# Patient Record
Sex: Male | Born: 1965
Health system: Southern US, Community
[De-identification: ages and names within clinical notes are randomized; demographics above are authoritative.]

## PROBLEM LIST (undated history)

## (undated) DIAGNOSIS — M1990 Unspecified osteoarthritis, unspecified site: Secondary | ICD-10-CM

## (undated) HISTORY — PX: VASECTOMY: SHX75

---

## 1995-07-01 HISTORY — PX: INGUINAL HERNIA REPAIR: SHX194

## 2019-04-11 DIAGNOSIS — E291 Testicular hypofunction: Secondary | ICD-10-CM | POA: Diagnosis not present

## 2019-04-11 DIAGNOSIS — Z0000 Encounter for general adult medical examination without abnormal findings: Secondary | ICD-10-CM | POA: Diagnosis not present

## 2019-04-11 DIAGNOSIS — Z1159 Encounter for screening for other viral diseases: Secondary | ICD-10-CM | POA: Diagnosis not present

## 2019-04-11 DIAGNOSIS — Z6827 Body mass index (BMI) 27.0-27.9, adult: Secondary | ICD-10-CM | POA: Diagnosis not present

## 2019-04-11 DIAGNOSIS — I10 Essential (primary) hypertension: Secondary | ICD-10-CM | POA: Diagnosis not present

## 2019-04-11 DIAGNOSIS — Z23 Encounter for immunization: Secondary | ICD-10-CM | POA: Diagnosis not present

## 2019-04-26 DIAGNOSIS — E291 Testicular hypofunction: Secondary | ICD-10-CM | POA: Diagnosis not present

## 2019-09-08 DIAGNOSIS — Z23 Encounter for immunization: Secondary | ICD-10-CM | POA: Diagnosis not present

## 2019-10-06 DIAGNOSIS — Z23 Encounter for immunization: Secondary | ICD-10-CM | POA: Diagnosis not present

## 2019-10-10 ENCOUNTER — Ambulatory Visit: Payer: Self-pay | Admitting: Legal Medicine

## 2019-10-12 ENCOUNTER — Ambulatory Visit (INDEPENDENT_AMBULATORY_CARE_PROVIDER_SITE_OTHER): Payer: 59 | Admitting: Legal Medicine

## 2019-10-12 ENCOUNTER — Other Ambulatory Visit: Payer: Self-pay

## 2019-10-12 ENCOUNTER — Encounter: Payer: Self-pay | Admitting: Legal Medicine

## 2019-10-12 DIAGNOSIS — I10 Essential (primary) hypertension: Secondary | ICD-10-CM

## 2019-10-12 DIAGNOSIS — E291 Testicular hypofunction: Secondary | ICD-10-CM | POA: Insufficient documentation

## 2019-10-12 HISTORY — DX: Essential (primary) hypertension: I10

## 2019-10-12 HISTORY — DX: Testicular hypofunction: E29.1

## 2019-10-12 NOTE — Assessment & Plan Note (Signed)
An individual hypertension care plan was established and reinforced today.  The patient's status was assessed using clinical findings on exam and labs or diagnostic tests. The patient's success at meeting treatment goals on disease specific evidence-based guidelines and found to be well controlled. SELF MANAGEMENT: The patient and I together assessed ways to personally work towards obtaining the recommended goals. RECOMMENDATIONS: avoid decongestants found in common cold remedies, decrease consumption of alcohol, perform routine monitoring of BP with home BP cuff, exercise, reduction of dietary salt, take medicines as prescribed, try not to miss doses and quit smoking.  Regular exercise and maintaining a healthy weight is needed.  Stress reduction may help. A CLINICAL SUMMARY including written plan identify barriers to care unique to individual due to social or financial issues.  We attempt to mutually creat solutions for individual and family understanding. He is to keep check on BP at home, he just got out of gym

## 2019-10-12 NOTE — Assessment & Plan Note (Signed)
Patient doing well on injectable testosterone.  Last checked 6 months ago.  Continue treatment.

## 2019-10-12 NOTE — Progress Notes (Signed)
Established Patient Office Visit  Subjective:  Patient ID: Nathan Hughes, male    DOB: [birth year]  Age: 54 y.o. MRN: [id number]  CC:  Chief Complaint  Patient presents with  . Hypertension  . Testicular Hypofunction    HPI Nathan Hughes presents for Chronic visit.  Patient presents for follow up of hypertension.  Patient tolerating losartan 33m well with side effects.  Patient was diagnosed with hypertension 2010 so has been treated for hypertension for 10 years.Patient is working on maintaining diet and exercise regimen and follows up as directed. Complication include none.  He has hypogonadism and on testosterone.  Past Medical History:  Diagnosis Date  . Benign essential hypertension 10/12/2019  . 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 INGUINAL HERNIA REPAIR Right 1997    Family History  Problem Relation Age of Onset  . Stroke Father   . Hypokalemia Sister     Social History   Socioeconomic History  . Marital status: Married    Spouse name: Not on file  . Number of children: 2  . Years of education: Not on file  . Highest education level: Not on file  Occupational History  . 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 Smoking status: Never Smoker  . Smokeless tobacco: Never Used  Substance and Sexual Activity  . Alcohol use: Yes    Alcohol/week: 4.0 standard drinks    Types: 4 Cans of beer per week    Comment: ocassionally  . Drug use: Never  . Sexual activity: Yes  Other Topics Concern  . Not on file  Social History Narrative  . Not on file   Social Determinants of Health   Financial Resource Strain:   . Difficulty of Paying Living Expenses:   Food Insecurity:   . Worried About RCharity fundraiserin the Last Year:   . RArboriculturistin the Last Year:   Transportation Needs:   . LFilm/video editor(Medical):   .Marland Kitchen Lack of Transportation (Non-Medical):   Physical Activity:   . Days of Exercise per Week:   . Minutes of Exercise per Session:   Stress:   . Feeling of Stress :   Social Connections:   . Frequency of Communication with Friends and Family:   . Frequency of Social Gatherings with Friends and Family:   . Attends Religious Services:   . Active Member of Clubs or Organizations:   . Attends CArchivistMeetings:   .Marland KitchenMarital Status:   Intimate Partner Violence:   . Fear of Current or Ex-Partner:   . Emotionally Abused:   .Marland KitchenPhysically Abused:   . Sexually Abused:     Outpatient Medications Prior to Visit  Medication Sig Dispense Refill  . losartan (COZAAR) 50 MG tablet Take 50 mg by mouth daily.    .Marland Kitchentestosterone cypionate (DEPOTESTOSTERONE CYPIONATE) 200 MG/ML injection Inject 200 mg into the muscle every 14 (fourteen) days.     No facility-administered medications prior to visit.    Allergies  Allergen Reactions  . Lisinopril Cough    ROS Review of Systems  Constitutional: Negative.   HENT: Negative.   Eyes: Negative.   Respiratory: Negative.   Cardiovascular: Negative.   Gastrointestinal: Negative.   Endocrine: Negative.   Genitourinary: Negative.   Musculoskeletal: Negative.   Skin: Negative.   Neurological: Negative.  Hematological: Negative.       Objective:    Physical Exam  Constitutional: He is oriented to person, place, and time. He appears well-developed and well-nourished.  HENT:  Head: Normocephalic and atraumatic.  Right Ear: External ear normal.  Left Ear: External ear normal.  Eyes: Pupils are equal, round, and reactive to light. Conjunctivae and EOM are normal.  Cardiovascular: Normal rate and normal heart sounds.  Abdominal: Soft. Bowel sounds are normal.  Musculoskeletal:        General: Normal range of motion.     Cervical back: Normal range of motion.  Neurological: He is alert and oriented to person, place, and time.  Skin: Skin  is dry.  Psychiatric: He has a normal mood and affect. His behavior is normal. Judgment and thought content normal.  Vitals reviewed.   BP (!) 150/100 (BP Location: Right Arm, Patient Position: Sitting)   Pulse 74   Temp 98.4 F (36.9 C) (Temporal)   Resp 16   Ht 5' 7"  (1.702 m)   Wt 184 lb (83.5 kg)   BMI 28.82 kg/m  Wt Readings from Last 3 Encounters:  10/12/19 184 lb (83.5 kg)     Health Maintenance Due  Topic Date Due  . HIV Screening  Never done  . TETANUS/TDAP  Never done  . COLONOSCOPY  Never done    There are no preventive care reminders to display for this patient.  No results found for: TSH No results found for: WBC, HGB, HCT, MCV, PLT No results found for: NA, K, CHLORIDE, CO2, GLUCOSE, BUN, CREATININE, BILITOT, ALKPHOS, AST, ALT, PROT, ALBUMIN, CALCIUM, ANIONGAP, EGFR, GFR No results found for: CHOL No results found for: HDL No results found for: LDLCALC No results found for: TRIG No results found for: CHOLHDL No results found for: HGBA1C    Assessment & Plan:   Problem List Items Addressed This Visit      Cardiovascular and Mediastinum   Benign essential hypertension    An individual hypertension care plan was established and reinforced today.  The patient's status was assessed using clinical findings on exam and labs or diagnostic tests. The patient's success at meeting treatment goals on disease specific evidence-based guidelines and found to be well controlled. SELF MANAGEMENT: The patient and I together assessed ways to personally work towards obtaining the recommended goals. RECOMMENDATIONS: avoid decongestants found in common cold remedies, decrease consumption of alcohol, perform routine monitoring of BP with home BP cuff, exercise, reduction of dietary salt, take medicines as prescribed, try not to miss doses and quit smoking.  Regular exercise and maintaining a healthy weight is needed.  Stress reduction may help. A CLINICAL SUMMARY including  written plan identify barriers to care unique to individual due to social or financial issues.  We attempt to mutually creat solutions for individual and family understanding. He is to keep check on BP at home, he just got out of gym      Relevant Medications   losartan (COZAAR) 50 MG tablet   Other Relevant Orders   CBC with Differential   Comprehensive metabolic panel   Lipid Panel     Endocrine   Testicular hypofunction    Patient doing well on injectable testosterone.  Last checked 6 months ago.  Continue treatment.         No orders of the defined types were placed in this encounter.   Follow-up: Return in about 6 months (around 04/12/2020) for fasting.    Reinaldo Meeker, MD

## 2019-10-13 ENCOUNTER — Ambulatory Visit: Payer: Self-pay | Admitting: Legal Medicine

## 2019-10-13 LAB — CBC WITH DIFFERENTIAL/PLATELET
Basophils Absolute: 0.1 10*3/uL (ref 0.0–0.2)
Basos: 2 %
EOS (ABSOLUTE): 0.2 10*3/uL (ref 0.0–0.4)
Eos: 5 %
Hematocrit: 42.9 % (ref 37.5–51.0)
Hemoglobin: 15.1 g/dL (ref 13.0–17.7)
Immature Grans (Abs): 0 10*3/uL (ref 0.0–0.1)
Immature Granulocytes: 0 %
Lymphocytes Absolute: 1.3 10*3/uL (ref 0.7–3.1)
Lymphs: 29 %
MCH: 33.4 pg — ABNORMAL HIGH (ref 26.6–33.0)
MCHC: 35.2 g/dL (ref 31.5–35.7)
MCV: 95 fL (ref 79–97)
Monocytes Absolute: 0.5 10*3/uL (ref 0.1–0.9)
Monocytes: 12 %
Neutrophils Absolute: 2.3 10*3/uL (ref 1.4–7.0)
Neutrophils: 52 %
Platelets: 260 10*3/uL (ref 150–450)
RBC: 4.52 x10E6/uL (ref 4.14–5.80)
RDW: 11.4 % — ABNORMAL LOW (ref 11.6–15.4)
WBC: 4.4 10*3/uL (ref 3.4–10.8)

## 2019-10-13 LAB — COMPREHENSIVE METABOLIC PANEL
ALT: 17 IU/L (ref 0–44)
AST: 22 IU/L (ref 0–40)
Albumin/Globulin Ratio: 1.6 (ref 1.2–2.2)
Albumin: 4.6 g/dL (ref 3.8–4.9)
Alkaline Phosphatase: 47 IU/L (ref 39–117)
BUN/Creatinine Ratio: 14 (ref 9–20)
BUN: 13 mg/dL (ref 6–24)
Bilirubin Total: 2 mg/dL — ABNORMAL HIGH (ref 0.0–1.2)
CO2: 25 mmol/L (ref 20–29)
Calcium: 9.9 mg/dL (ref 8.7–10.2)
Chloride: 99 mmol/L (ref 96–106)
Creatinine, Ser: 0.9 mg/dL (ref 0.76–1.27)
GFR calc Af Amer: 112 mL/min/{1.73_m2} (ref >59)
GFR calc non Af Amer: 96 mL/min/{1.73_m2} (ref >59)
Globulin, Total: 2.8 g/dL (ref 1.5–4.5)
Glucose: 92 mg/dL (ref 65–99)
Potassium: 4.9 mmol/L (ref 3.5–5.2)
Sodium: 138 mmol/L (ref 134–144)
Total Protein: 7.4 g/dL (ref 6.0–8.5)

## 2019-10-13 LAB — LIPID PANEL
Chol/HDL Ratio: 1.8 ratio (ref 0.0–5.0)
Cholesterol, Total: 170 mg/dL (ref 100–199)
HDL: 92 mg/dL (ref >39)
LDL Chol Calc (NIH): 69 mg/dL (ref 0–99)
Triglycerides: 44 mg/dL (ref 0–149)
VLDL Cholesterol Cal: 9 mg/dL (ref 5–40)

## 2019-10-13 LAB — CARDIOVASCULAR RISK ASSESSMENT

## 2019-10-13 NOTE — Progress Notes (Signed)
CBC normal, Kidney and liver tests normal, bilirubin up, Cholesterol normal lp

## 2019-11-13 ENCOUNTER — Other Ambulatory Visit: Payer: Self-pay | Admitting: Legal Medicine

## 2019-11-13 DIAGNOSIS — E291 Testicular hypofunction: Secondary | ICD-10-CM

## 2020-01-25 ENCOUNTER — Other Ambulatory Visit: Payer: Self-pay

## 2020-01-25 MED ORDER — LOSARTAN POTASSIUM 50 MG PO TABS: 50.0000 mg | ORAL_TABLET | Freq: Every day | ORAL | 2 refills | Status: DC

## 2020-02-05 ENCOUNTER — Other Ambulatory Visit: Payer: Self-pay | Admitting: Legal Medicine

## 2020-02-06 ENCOUNTER — Other Ambulatory Visit: Payer: Self-pay | Admitting: Legal Medicine

## 2020-02-06 MED ORDER — LOSARTAN POTASSIUM 50 MG PO TABS: 50.0000 mg | ORAL_TABLET | Freq: Every day | ORAL | 1 refills | Status: DC

## 2020-04-12 ENCOUNTER — Encounter: Payer: Self-pay | Admitting: Legal Medicine

## 2020-04-12 ENCOUNTER — Ambulatory Visit: Payer: 59 | Admitting: Legal Medicine

## 2020-04-12 ENCOUNTER — Other Ambulatory Visit: Payer: Self-pay

## 2020-04-12 VITALS — BP 110/80 | HR 63 | Temp 97.6°F | Ht 67.0 in | Wt 182.6 lb

## 2020-04-12 DIAGNOSIS — E782 Mixed hyperlipidemia: Secondary | ICD-10-CM

## 2020-04-12 DIAGNOSIS — Z6828 Body mass index (BMI) 28.0-28.9, adult: Secondary | ICD-10-CM | POA: Diagnosis not present

## 2020-04-12 DIAGNOSIS — Z0000 Encounter for general adult medical examination without abnormal findings: Secondary | ICD-10-CM

## 2020-04-12 DIAGNOSIS — E291 Testicular hypofunction: Secondary | ICD-10-CM

## 2020-04-12 DIAGNOSIS — I10 Essential (primary) hypertension: Secondary | ICD-10-CM

## 2020-04-12 DIAGNOSIS — N400 Enlarged prostate without lower urinary tract symptoms: Secondary | ICD-10-CM

## 2020-04-12 MED ORDER — LOSARTAN POTASSIUM 50 MG PO TABS: 50.0000 mg | ORAL_TABLET | Freq: Every day | ORAL | 2 refills | Status: DC

## 2020-04-12 MED ORDER — TESTOSTERONE CYPIONATE 200 MG/ML IM SOLN: INTRAMUSCULAR | 2 refills | Status: DC

## 2020-04-12 NOTE — Patient Instructions (Signed)
Preventive Care 54-54 Years Old, Male Preventive care refers to lifestyle choices and visits with your health care provider that can promote health and wellness. This includes:  A yearly physical exam. This is also called an annual well check.  Regular dental and eye exams.  Immunizations.  Screening for certain conditions.  Healthy lifestyle choices, such as eating a healthy diet, getting regular exercise, not using drugs or products that contain nicotine and tobacco, and limiting alcohol use. What can I expect for my preventive care visit? Physical exam Your health care provider will check:  Height and weight. These may be used to calculate body mass index (BMI), which is a measurement that tells if you are at a healthy weight.  Heart rate and blood pressure.  Your skin for abnormal spots. Counseling Your health care provider may ask you questions about:  Alcohol, tobacco, and drug use.  Emotional well-being.  Home and relationship well-being.  Sexual activity.  Eating habits.  Work and work Statistician. What immunizations do I need?  Influenza (flu) vaccine  This is recommended every year. Tetanus, diphtheria, and pertussis (Tdap) vaccine  You may need a Td booster every 10 years. Varicella (chickenpox) vaccine  You may need this vaccine if you have not already been vaccinated. Zoster (shingles) vaccine  You may need this after age 54. Measles, mumps, and rubella (MMR) vaccine  You may need at least one dose of MMR if you were born in 1957 or later. You may also need a second dose. Pneumococcal conjugate (PCV13) vaccine  You may need this if you have certain conditions and were not previously vaccinated. Pneumococcal polysaccharide (PPSV23) vaccine  You may need one or two doses if you smoke cigarettes or if you have certain conditions. Meningococcal conjugate (MenACWY) vaccine  You may need this if you have certain conditions. Hepatitis A  vaccine  You may need this if you have certain conditions or if you travel or work in places where you may be exposed to hepatitis A. Hepatitis B vaccine  You may need this if you have certain conditions or if you travel or work in places where you may be exposed to hepatitis B. Haemophilus influenzae type b (Hib) vaccine  You may need this if you have certain risk factors. Human papillomavirus (HPV) vaccine  If recommended by your health care provider, you may need three doses over 6 months. You may receive vaccines as individual doses or as more than one vaccine together in one shot (combination vaccines). Talk with your health care provider about the risks and benefits of combination vaccines. What tests do I need? Blood tests  Lipid and cholesterol levels. These may be checked every 5 years, or more frequently if you are over 60 years old.  Hepatitis C test.  Hepatitis B test. Screening  Lung cancer screening. You may have this screening every year starting at age 54 if you have a 30-pack-year history of smoking and currently smoke or have quit within the past 15 years.  Prostate cancer screening. Recommendations will vary depending on your family history and other risks.  Colorectal cancer screening. All adults should have this screening starting at age 54 and continuing until age 2. Your health care provider may recommend screening at age 54 if you are at increased risk. You will have tests every 1-10 years, depending on your results and the type of screening test.  Diabetes screening. This is done by checking your blood sugar (glucose) after you have not eaten  for a while (fasting). You may have this done every 1-3 years.  Sexually transmitted disease (STD) testing. Follow these instructions at home: Eating and drinking  Eat a diet that includes fresh fruits and vegetables, whole grains, lean protein, and low-fat dairy products.  Take vitamin and mineral supplements as  recommended by your health care provider.  Do not drink alcohol if your health care provider tells you not to drink.  If you drink alcohol: ? Limit how much you have to 0-2 drinks a day. ? Be aware of how much alcohol is in your drink. In the [place] one drink equals one 12 oz bottle of beer (355 mL), one 5 oz glass of wine (148 mL), or one 1 oz glass of hard liquor (44 mL). Lifestyle  Take daily care of your teeth and gums.  Stay active. Exercise for at least 30 minutes on 5 or more days each week.  Do not use any products that contain nicotine or tobacco, such as cigarettes, e-cigarettes, and chewing tobacco. If you need help quitting, ask your health care provider.  If you are sexually active, practice safe sex. Use a condom or other form of protection to prevent STIs (sexually transmitted infections).  Talk with your health care provider about taking a low-dose aspirin every day starting at age 54. What's next?  Go to your health care provider once a year for a well check visit.  Ask your health care provider how often you should have your eyes and teeth checked.  Stay up to date on all vaccines. This information is not intended to replace advice given to you by your health care provider. Make sure you discuss any questions you have with your health care provider. Document Revised: 06/10/2018 Document Reviewed: 06/10/2018 Elsevier Patient Education  2020 [employer].

## 2020-04-12 NOTE — Progress Notes (Signed)
Subjective:  Patient ID: Nathan Hughes, male    DOB: [birth year]  Age: 54 y.o. MRN: [id number]  Chief Complaint  Patient presents with  . Annual Exam    Pt is fasting, needs refills on medications    HPI  Well Adult Physical: Patient here for a comprehensive physical exam.The patient reports problems - arthritis hands an delbow left side Do you take any herbs or supplements that were not prescribed by a doctor? no Are you taking calcium supplements? no Are you taking aspirin daily? no  Encounter for general adult medical examination without abnormal findings  Physical ("At Risk" items are starred): Patient's last physical exam was 1 year ago .  Smoking: Life-long non-smoker ;  Physical Activity: Exercises at least 3 times per week ;  Alcohol/Drug Use: Is a non-drinker ; No illicit drug use ;  Patient is not afflicted from Stress Incontinence and Urge Incontinence  Safety: reviewed ; Patient wears a seat belt, has smoke detectors, has carbon monoxide detectors, practices appropriate gun safety, and wears sunscreen with extended sun exposure. Dental Care: biannual cleanings, brushes and flosses daily. Ophthalmology/Optometry: Annual visit.  Hearing loss: none Vision impairments: none Last PSA:?  Patient presents for follow up of hypertension.  Patient tolerating losartan well with side effects.  Patient was diagnosed with hypertension 2010 so has been treated for hypertension for 10 years.Patient is working on maintaining diet and exercise regimen and follows up as directed. Complication include none.  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    Office Visit from 04/12/2020 in [facility]  PHQ-2 Total Score 0              Social History   Socioeconomic History  . Marital status: Married    Spouse name: Not on file  . Number of children: 2  . Years of education: Not on file  . Highest education level: Not on file  Occupational History  . Occupation:  Event organiser: Personnel officer  . Occupation: Emergency planning/management officer  Tobacco Use  . Smoking status: Never Smoker  . Smokeless tobacco: Never Used  Substance and Sexual Activity  . Alcohol use: Yes    Alcohol/week: 4.0 standard drinks    Types: 4 Cans of beer per week    Comment: ocassionally  . Drug use: Never  . Sexual activity: Yes    Partners: Female  Other Topics Concern  . Not on file  Social History Narrative  . Not on file   Social Determinants of Health   Financial Resource Strain:   . Difficulty of Paying Living Expenses: Not on file  Food Insecurity:   . Worried About Programme researcher, broadcasting/film/video in the Last Year: Not on file  . Ran Out of Food in the Last Year: Not on file  Transportation Needs:   . Lack of Transportation (Medical): Not on file  . Lack of Transportation (Non-Medical): Not on file  Physical Activity:   . Days of Exercise per Week: Not on file  . Minutes of Exercise per Session: Not on file  Stress:   . Feeling of Stress : Not on file  Social Connections:   . Frequency of Communication with Friends and Family: Not on file  . Frequency of Social Gatherings with Friends and Family: Not on file  . Attends Religious Services: Not on file  . Active Member of Clubs or Organizations: Not on file  . Attends Banker Meetings: Not on  file  . Marital Status: Not on file   Past Medical History:  Diagnosis Date  . Benign essential hypertension 10/12/2019  . 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 INGUINAL HERNIA REPAIR Right 1997    Family History  Problem Relation Age of Onset  . Stroke Father   . Hypokalemia Sister    Social History   Socioeconomic History  . Marital status: Married    Spouse name: Not on file  . Number of children: 2  . Years of education: Not on file  . Highest education level: Not on file  Occupational History  . 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Engineer, maintenance  . Occupation: Emergency planning/management officer  Tobacco Use  . Smoking status: Never Smoker  . Smokeless tobacco: Never Used  Substance and Sexual Activity  . Alcohol use: Yes    Alcohol/week: 4.0 standard drinks    Types: 4 Cans of beer per week    Comment: ocassionally  . Drug use: Never  . Sexual activity: Yes    Partners: Female  Other Topics Concern  . Not on file  Social History Narrative  . Not on file   Social Determinants of Health   Financial Resource Strain:   . Difficulty of Paying Living Expenses: Not on file  Food Insecurity:   . Worried About Programme researcher, broadcasting/film/video in the Last Year: Not on file  . Ran Out of Food in the Last Year: Not on file  Transportation Needs:   . Lack of Transportation (Medical): Not on file  . Lack of Transportation (Non-Medical): Not on file  Physical Activity:   . Days of Exercise per Week: Not on file  . Minutes of Exercise per Session: Not on file  Stress:   . Feeling of Stress : Not on file  Social Connections:   . Frequency of Communication with Friends and Family: Not on file  . Frequency of Social Gatherings with Friends and Family: Not on file  . Attends Religious Services: Not on file  . Active Member of Clubs or Organizations: Not on file  . Attends Banker Meetings: Not on file  . Marital Status: Not on file   Review of Systems  Constitutional: Negative for activity change and appetite change.  HENT: Negative.   Eyes: Negative.   Respiratory: Negative for cough, shortness of breath and stridor.   Cardiovascular: Negative for chest pain, palpitations and leg swelling.  Gastrointestinal: Negative.   Endocrine: Negative.   Genitourinary: Negative.   Musculoskeletal: Negative.   Skin: Negative.   Neurological: Negative.   Psychiatric/Behavioral: Negative.      Objective:  BP 110/80   Pulse 63   Temp 97.6 F (36.4 C)   Ht 5\' 7"  (1.702 m)   Wt 182 lb 9.6 oz (82.8 kg)   SpO2 97%   BMI 28.60 kg/m    BP/Weight 04/12/2020 10/12/2019  Systolic BP 110 150  Diastolic BP 80 100  Wt. (Lbs) 182.6 184  BMI 28.6 28.82    Physical Exam Vitals reviewed.  Constitutional:      Appearance: Normal appearance. He is normal weight.  HENT:     Head: Normocephalic and atraumatic.     Right Ear: Tympanic membrane, ear canal and external ear normal.     Left Ear: Tympanic membrane, ear canal and external ear normal.     Nose: Nose normal.     Mouth/Throat:  Pharynx: Oropharynx is clear.  Eyes:     Extraocular Movements: Extraocular movements intact.     Conjunctiva/sclera: Conjunctivae normal.     Pupils: Pupils are equal, round, and reactive to light.  Cardiovascular:     Rate and Rhythm: Normal rate and regular rhythm.     Pulses: Normal pulses.     Heart sounds: Normal heart sounds.  Pulmonary:     Effort: Pulmonary effort is normal.     Breath sounds: Normal breath sounds.  Abdominal:     General: Abdomen is flat. Bowel sounds are normal.  Genitourinary:    Comments: Prostate exam refused by patient Musculoskeletal:        General: Normal range of motion.     Cervical back: Normal range of motion and neck supple.  Skin:    General: Skin is warm.     Capillary Refill: Capillary refill takes less than 2 seconds.     Findings: No rash.  Neurological:     General: No focal deficit present.     Mental Status: He is alert and oriented to person, place, and time. Mental status is at baseline.  Psychiatric:        Mood and Affect: Mood normal.        Behavior: Behavior normal.        Thought Content: Thought content normal.        Judgment: Judgment normal.     Lab Results  Component Value Date   WBC 4.7 04/12/2020   HGB 15.3 04/12/2020   HCT 44.9 04/12/2020   PLT 250 04/12/2020   GLUCOSE 89 04/12/2020   CHOL 189 04/12/2020   TRIG 61 04/12/2020   HDL 85 04/12/2020   LDLCALC 93 04/12/2020   ALT 23 04/12/2020   AST 22 04/12/2020   NA 138 04/12/2020   K 5.0 04/12/2020    CL 99 04/12/2020   CREATININE 1.01 04/12/2020   BUN 24 04/12/2020   CO2 25 04/12/2020      Assessment & Plan:  1. BMI 28.0-28.9,adult Weight stable we discussed maybe 5 lb weight loss  2. Benign essential hypertension - losartan (COZAAR) 50 MG tablet; Take 1 tablet (50 mg total) by mouth daily.  Dispense: 90 tablet; Refill: 2 - CBC with Differential/Platelet - Comprehensive metabolic panel An individual hypertension care plan was established and reinforced today.  The patient's status was assessed using clinical findings on exam and labs or diagnostic tests. The patient's success at meeting treatment goals on disease specific evidence-based guidelines and found to be well controlled. SELF MANAGEMENT: The patient and I together assessed ways to personally work towards obtaining the recommended goals. RECOMMENDATIONS: avoid decongestants found in common cold remedies, decrease consumption of alcohol, perform routine monitoring of BP with home BP cuff, exercise, reduction of dietary salt, take medicines as prescribed, try not to miss doses and quit smoking.  Regular exercise and maintaining a healthy weight is needed.  Stress reduction may help. A CLINICAL SUMMARY including written plan identify barriers to care unique to individual due to social or financial issues.  We attempt to mutually creat solutions for individual and family understanding.  3. Testicular hypofunction - testosterone cypionate (DEPOTESTOSTERONE CYPIONATE) 200 MG/ML injection; INJECT 1 MILLILITERS (200 MG) BY INTRAMUSCULAR ROUTE EVERY 2 WEEKS  Dispense: 10 mL; Refill: 2 AN INDIVIDUAL CARE PLAN for testicular hypofunction was established and reinforced today.  The patient's status was assessed using clinical findings on exam, labs, and other diagnostic testing. Patient's success at meeting  treatment goals based on disease specific evidence-bassed guidelines and found to be in good control. RECOMMENDATIONS include  maintaining present medicines and treatment.  4. Benign prostatic hyperplasia without lower urinary tract symptoms AN INDIVIDUAL CARE PLAN History of BPH was established and reinforced today.  The patient's status was assessed using clinical findings on exam, labs, and other diagnostic testing. Patient's success at meeting treatment goals based on disease specific evidence-bassed guidelines and found to be in good control. RECOMMENDATIONS include maintaining present medicines and treatment.  5. Routine general medical examination at a health care facility - PSA Routine wellness discussed  6. Mixed hyperlipidemia - Lipid panel AN INDIVIDUAL CARE PLAN for hyperlipidemia/ cholesterol was established and reinforced today.  The patient's status was assessed using clinical findings on exam, lab and other diagnostic tests. The patient's disease status was assessed based on evidence-based guidelines and found to be well controlled. MEDICATIONS were reviewed. SELF MANAGEMENT GOALS have been discussed and patient's success at attaining the goal of low cholesterol was assessed. RECOMMENDATION given include regular exercise 3 days a week and low cholesterol/low fat diet. CLINICAL SUMMARY including written plan to identify barriers unique to the patient due to social or economic  reasons was discussed.    Body mass index is 28.6 kg/m.   These are the goals we discussed: Goals    . Activity and Exercise Increased     Evidence-based guidance:  Review current exercise levels.  Assess patient perspective on exercise or activity level, barriers to increasing activity, motivation and readiness for change.  Recommend or set healthy exercise goal based on individual tolerance.  Encourage small steps toward making change in amount of exercise or activity.  Urge reduction of sedentary activities or screen time.  Promote group activities within the community or with family or support person.  Consider  referral to rehabiliation therapist for assessment and exercise/activity plan.   Notes:         This is a list of the screening recommended for you and due dates:  Health Maintenance  Topic Date Due  .  Hepatitis C: One time screening is recommended by Center for Disease Control  (CDC) for  adults born from [birth year] through 1965.   Never done  . HIV Screening  Never done  . Tetanus Vaccine  Never done  . Flu Shot  01/29/2020  . Colon Cancer Screening  10/31/2028  . COVID-19 Vaccine  Completed     AN INDIVIDUALIZED CARE PLAN: was established or reinforced today.   SELF MANAGEMENT: The patient and I together assessed ways to personally work towards obtaining the recommended goals  Support needs The patient and/or family needs were assessed and services were offered if appropriate.  Meds ordered this encounter  Medications  . losartan (COZAAR) 50 MG tablet    Sig: Take 1 tablet (50 mg total) by mouth daily.    Dispense:  90 tablet    Refill:  2  . testosterone cypionate (DEPOTESTOSTERONE CYPIONATE) 200 MG/ML injection    Sig: INJECT 1 MILLILITERS (200 MG) BY INTRAMUSCULAR ROUTE EVERY 2 WEEKS    Dispense:  10 mL    Refill:  2    This request is for a new prescription for a controlled substance as required by Federal/State law.    Follow-up: Return in about 1 year (around 04/12/2021).  An After Visit Summary was printed and given to the patient.  Brent Bulla [facility] [phone]

## 2020-04-13 LAB — CBC WITH DIFFERENTIAL/PLATELET
Basophils Absolute: 0.1 10*3/uL (ref 0.0–0.2)
Basos: 2 %
EOS (ABSOLUTE): 0.2 10*3/uL (ref 0.0–0.4)
Eos: 4 %
Hematocrit: 44.9 % (ref 37.5–51.0)
Hemoglobin: 15.3 g/dL (ref 13.0–17.7)
Immature Grans (Abs): 0 10*3/uL (ref 0.0–0.1)
Immature Granulocytes: 0 %
Lymphocytes Absolute: 1.4 10*3/uL (ref 0.7–3.1)
Lymphs: 30 %
MCH: 32.8 pg (ref 26.6–33.0)
MCHC: 34.1 g/dL (ref 31.5–35.7)
MCV: 96 fL (ref 79–97)
Monocytes Absolute: 0.6 10*3/uL (ref 0.1–0.9)
Monocytes: 12 %
Neutrophils Absolute: 2.4 10*3/uL (ref 1.4–7.0)
Neutrophils: 52 %
Platelets: 250 10*3/uL (ref 150–450)
RBC: 4.67 x10E6/uL (ref 4.14–5.80)
RDW: 11.4 % — ABNORMAL LOW (ref 11.6–15.4)
WBC: 4.7 10*3/uL (ref 3.4–10.8)

## 2020-04-13 LAB — COMPREHENSIVE METABOLIC PANEL
ALT: 23 IU/L (ref 0–44)
AST: 22 IU/L (ref 0–40)
Albumin/Globulin Ratio: 1.8 (ref 1.2–2.2)
Albumin: 4.8 g/dL (ref 3.8–4.9)
Alkaline Phosphatase: 51 IU/L (ref 44–121)
BUN/Creatinine Ratio: 24 — ABNORMAL HIGH (ref 9–20)
BUN: 24 mg/dL (ref 6–24)
Bilirubin Total: 2.6 mg/dL — ABNORMAL HIGH (ref 0.0–1.2)
CO2: 25 mmol/L (ref 20–29)
Calcium: 10 mg/dL (ref 8.7–10.2)
Chloride: 99 mmol/L (ref 96–106)
Creatinine, Ser: 1.01 mg/dL (ref 0.76–1.27)
GFR calc Af Amer: 97 mL/min/{1.73_m2} (ref >59)
GFR calc non Af Amer: 84 mL/min/{1.73_m2} (ref >59)
Globulin, Total: 2.7 g/dL (ref 1.5–4.5)
Glucose: 89 mg/dL (ref 65–99)
Potassium: 5 mmol/L (ref 3.5–5.2)
Sodium: 138 mmol/L (ref 134–144)
Total Protein: 7.5 g/dL (ref 6.0–8.5)

## 2020-04-13 LAB — CARDIOVASCULAR RISK ASSESSMENT

## 2020-04-13 LAB — LIPID PANEL
Chol/HDL Ratio: 2.2 ratio (ref 0.0–5.0)
Cholesterol, Total: 189 mg/dL (ref 100–199)
HDL: 85 mg/dL (ref >39)
LDL Chol Calc (NIH): 93 mg/dL (ref 0–99)
Triglycerides: 61 mg/dL (ref 0–149)
VLDL Cholesterol Cal: 11 mg/dL (ref 5–40)

## 2020-04-13 LAB — PSA: Prostate Specific Ag, Serum: 1.9 ng/mL (ref 0.0–4.0)

## 2020-04-13 NOTE — Progress Notes (Signed)
CBC normal, kidney and liver test normal, bilirubin is up from testosterone, Cholesterol OK, PSA 1.9 lp

## 2020-05-18 ENCOUNTER — Other Ambulatory Visit: Payer: Self-pay | Admitting: Legal Medicine

## 2020-05-18 ENCOUNTER — Other Ambulatory Visit: Payer: Self-pay

## 2020-05-18 DIAGNOSIS — E291 Testicular hypofunction: Secondary | ICD-10-CM

## 2020-05-18 MED ORDER — TESTOSTERONE CYPIONATE 200 MG/ML IM SOLN: INTRAMUSCULAR | 3 refills | Status: DC

## 2020-05-28 ENCOUNTER — Other Ambulatory Visit: Payer: Self-pay

## 2020-05-28 DIAGNOSIS — E291 Testicular hypofunction: Secondary | ICD-10-CM

## 2020-05-28 MED ORDER — TESTOSTERONE CYPIONATE 200 MG/ML IM SOLN: INTRAMUSCULAR | 3 refills | Status: DC

## 2020-06-21 MED FILL — TESTOSTERONE CYP 200 MG/ML: 200 | 28 days supply | Qty: 2 | Fill #0

## 2020-07-12 DIAGNOSIS — Z23 Encounter for immunization: Secondary | ICD-10-CM | POA: Diagnosis not present

## 2020-08-08 MED FILL — TESTOSTERONE CYP 200 MG/ML: 200 | 28 days supply | Qty: 2 | Fill #1

## 2020-09-11 ENCOUNTER — Other Ambulatory Visit: Payer: Self-pay

## 2020-09-11 DIAGNOSIS — Z005 Encounter for examination of potential donor of organ and tissue: Secondary | ICD-10-CM

## 2020-09-17 NOTE — Progress Notes (Signed)
Cardiology Office Note:   Date:  09/19/2020  NAME:  Nathan Hughes    MRN: [id number] DOB:  1966-05-20   PCP:  Abigail Miyamoto, MD  Cardiologist:  No primary care provider on file.  Electrophysiologist:  None   Referring MD: Abigail Miyamoto,*   Chief Complaint  Patient presents with  . Transplant Donor Evaluation    History of Present Illness:   Nathan Hughes is a 55 y.o. male with a hx of low testosterone who is being seen today for the evaluation of willingness to be an organ donor at the request of Abigail Miyamoto, MD. Needs echocardiogram and stress test to be a donor.   He reports he will donate a kidney to his sister in [place].  He will undergo kidney donation in [place] at the [facility].  His medical history is significant for hypertension.  BP is well controlled 132/84.  He takes losartan.  He also takes testosterone supplementation.  He reports that he has no symptoms of chest pain or shortness of breath.  His EKG demonstrates a first-degree AV block.  There is no evidence of acute ischemic changes or evidence of infarction.  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  He works at the hospital.  Apparently he retired but had to find a part-time job.  His sister suffers from a rare kidney disorder.  He is a never smoker.  He does not drink alcohol in excess.  No drug use is reported.  He is married and has 2 children.  Most recent lipid profile demonstrates exceedingly high HDL cholesterol.  Family history of heart diseases present.  He reports he can walk 30,000 steps daily without any limitations.  He also does quite a bit of weight lifting.  No limitations with this either.  T chol 189, HDL 85, LDL 93, TG 61  Past Medical History: Past Medical History:  Diagnosis Date  . Benign essential hypertension 10/12/2019  . 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 INGUINAL HERNIA REPAIR Right 1997    Current Medications: Current Meds  Medication Sig  . losartan (COZAAR) 50 MG tablet Take 1 tablet (50 mg total) by mouth daily.  Marland Kitchen testosterone cypionate (DEPOTESTOSTERONE CYPIONATE) 200 MG/ML injection One ml IM every 2 weeks     Allergies:    Lisinopril   Social History: Social History   Socioeconomic History  . Marital status: Married    Spouse name: Not on file  . Number of children: 2  . Years of education: Not on file  . Highest education level: Not on file  Occupational History  . 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Personnel officer  . Occupation: Emergency planning/management officer  Tobacco Use  . Smoking status: Never Smoker  . Smokeless tobacco: Never Used  Substance and Sexual Activity  . Alcohol use: Yes    Alcohol/week: 4.0 standard drinks    Types: 4 Cans of beer per week    Comment: ocassionally  . Drug use: Never  . Sexual activity: Yes    Partners: Female  Other Topics Concern  . Not on file  Social History Narrative  . Not on file   Social Determinants of Health   Financial Resource Strain: Not on file  Food Insecurity: Not on file  Transportation Needs: Not on file  Physical Activity: Not on file  Stress: Not on file  Social Connections: Not on file    Family History: The patient's family history includes Heart disease in his father; Hypokalemia in his sister; Kidney disease in his sister; Stroke in his father.  ROS:   All other ROS reviewed and negative. Pertinent positives noted in the HPI.     EKGs/Labs/Other Studies Reviewed:   The following studies were personally reviewed by me today:  EKG:  EKG is ordered today.  The ekg ordered today demonstrates normal sinus rhythm heart rate 68, first-degree AV block noted, no acute ischemic changes or evidence of infarction, and was personally reviewed by me.   Recent Labs: 04/12/2020: ALT 23; BUN 24; Creatinine, Ser 1.01;  Hemoglobin 15.3; Platelets 250; Potassium 5.0; Sodium 138   Recent Lipid Panel    Component Value Date/Time   CHOL 189 04/12/2020 0810   TRIG 61 04/12/2020 0810   HDL 85 04/12/2020 0810   CHOLHDL 2.2 04/12/2020 0810   LDLCALC 93 04/12/2020 0810    Physical Exam:   VS:  BP 132/84   Pulse 70   Ht 5\' 9"  (1.753 m)   Wt 193 lb 9.6 oz (87.8 kg)   SpO2 99%   BMI 28.59 kg/m    Wt Readings from Last 3 Encounters:  09/19/20 193 lb 9.6 oz (87.8 kg)  04/12/20 182 lb 9.6 oz (82.8 kg)  10/12/19 184 lb (83.5 kg)    General: Well nourished, well developed, in no acute distress Head: Atraumatic, normal size  Eyes: PEERLA, EOMI  Neck: Supple, no JVD Endocrine: No thryomegaly Cardiac: Normal S1, S2; RRR; no murmurs, rubs, or gallops Lungs: Clear to auscultation bilaterally, no wheezing, rhonchi or rales  Abd: Soft, nontender, no hepatomegaly  Ext: No edema, pulses 2+ Musculoskeletal: No deformities, BUE and BLE strength normal and equal Skin: Warm and dry, no rashes   Neuro: Alert and oriented to person, place, time, and situation, CNII-XII grossly intact, no focal deficits  Psych: Normal mood and affect   ASSESSMENT:   Nathan Hughes is a 55 y.o. male who presents for the following: 1. Willingness to be an organ donor   2. First degree AV block   3. Primary hypertension     PLAN:   1. Willingness to be an organ donor -Evaluation for his willingness to be a donor.  Apparently he will donate a kidney to his sister.  EKG in office demonstrates a first-degree AV block which is a benign entity.  Cardiovascular examination today is normal. -[facility] will require an echocardiogram as well as a stress test.  We will set him up for an exercise nuclear stress test.  He has no symptoms of angina.  He has no shortness of breath.  He is quite healthy.  I think he will be a great candidate for organ donation.  2. First degree AV block -Benign entity.  No  need for further evaluation.  He will get an echo and a stress test as a part of his work-up for organ donation.  3. Primary hypertension -Well-controlled on losartan.  Disposition: Return if symptoms worsen or fail to improve.  Medication Adjustments/Labs and Tests Ordered: Current medicines are reviewed at length with the patient today.  Concerns regarding medicines are outlined above.  Orders Placed This Encounter  Procedures  . MYOCARDIAL PERFUSION IMAGING  . EKG 12-Lead  . ECHOCARDIOGRAM COMPLETE   No orders of the defined types were placed in this encounter.   Patient  Instructions  Medication Instructions:  The current medical regimen is effective;  continue present plan and medications.  *If you need a refill on your cardiac medications before your next appointment, please call your pharmacy*  Testing/Procedures:  Your physician has requested that you have an Exercise Myoview (schedule at [address]). A cardiac stress test is a cardiological test that measures the heart's ability to respond to external stress in a controlled clinical environment. The stress response is induced by exercise (exercise-treadmill). For further information please visit [email or web address] If you have questions or concerns about your appointment, you can call the Nuclear Lab at [phone]. *COVID TESTING NEEDED*   Echocardiogram - Your physician has requested that you have an echocardiogram. Echocardiography is a painless test that uses sound waves to create images of your heart. It provides your doctor with information about the size and shape of your heart and how well your heart's chambers and valves are working. This procedure takes approximately one hour. There are no restrictions for this procedure. This will be performed at our [facility] location - [address].   Follow-Up: At [facility], you and your health needs are our priority.  As part of our continuing mission to  provide you with exceptional heart care, we have created designated Provider Care Teams.  These Care Teams include your primary Cardiologist (physician) and Advanced Practice Providers (APPs -  Physician Assistants and Nurse Practitioners) who all work together to provide you with the care you need, when you need it.  We recommend signing up for the patient portal called "MyChart".  Sign up information is provided on this After Visit Summary.  MyChart is used to connect with patients for Virtual Visits (Telemedicine).  Patients are able to view lab/test results, encounter notes, upcoming appointments, etc.  Non-urgent messages can be sent to your provider as well.   To learn more about what you can do with MyChart, go to [email or web address].    Your next appointment:   As needed  The format for your next appointment:   In Person  Provider:   Lennie Odor, MD        Signed, Lenna Gilford. Flora Lipps, MD, [facility]  [facility]  [address], [address] [place], [place] [address] [phone]  09/19/2020 2:14 PM

## 2020-09-19 ENCOUNTER — Encounter: Payer: Self-pay | Admitting: Cardiovascular Disease

## 2020-09-19 ENCOUNTER — Ambulatory Visit (INDEPENDENT_AMBULATORY_CARE_PROVIDER_SITE_OTHER): Payer: 59 | Admitting: Cardiovascular Disease

## 2020-09-19 ENCOUNTER — Other Ambulatory Visit: Payer: Self-pay

## 2020-09-19 VITALS — BP 132/84 | HR 70 | Ht 69.0 in | Wt 193.6 lb

## 2020-09-19 DIAGNOSIS — I440 Atrioventricular block, first degree: Secondary | ICD-10-CM | POA: Diagnosis not present

## 2020-09-19 DIAGNOSIS — I10 Essential (primary) hypertension: Secondary | ICD-10-CM

## 2020-09-19 DIAGNOSIS — Z005 Encounter for examination of potential donor of organ and tissue: Secondary | ICD-10-CM | POA: Diagnosis not present

## 2020-09-19 NOTE — Patient Instructions (Addendum)
Medication Instructions:  The current medical regimen is effective;  continue present plan and medications.  *If you need a refill on your cardiac medications before your next appointment, please call your pharmacy*  Testing/Procedures:  Your physician has requested that you have an Exercise Myoview (schedule at [address]). A cardiac stress test is a cardiological test that measures the heart's ability to respond to external stress in a controlled clinical environment. The stress response is induced by exercise (exercise-treadmill). For further information please visit [email or web address] If you have questions or concerns about your appointment, you can call the Nuclear Lab at [phone]. *COVID TESTING NEEDED*   Echocardiogram - Your physician has requested that you have an echocardiogram. Echocardiography is a painless test that uses sound waves to create images of your heart. It provides your doctor with information about the size and shape of your heart and how well your heart's chambers and valves are working. This procedure takes approximately one hour. There are no restrictions for this procedure. This will be performed at our [facility] location - [address].   Follow-Up: At [facility], you and your health needs are our priority.  As part of our continuing mission to provide you with exceptional heart care, we have created designated Provider Care Teams.  These Care Teams include your primary Cardiologist (physician) and Advanced Practice Providers (APPs -  Physician Assistants and Nurse Practitioners) who all work together to provide you with the care you need, when you need it.  We recommend signing up for the patient portal called "MyChart".  Sign up information is provided on this After Visit Summary.  MyChart is used to connect with patients for Virtual Visits (Telemedicine).  Patients are able to view lab/test results, encounter notes, upcoming appointments,  etc.  Non-urgent messages can be sent to your provider as well.   To learn more about what you can do with MyChart, go to [email or web address].    Your next appointment:   As needed  The format for your next appointment:   In Person  Provider:   Lennie Odor, MD

## 2020-09-20 ENCOUNTER — Telehealth: Payer: Self-pay

## 2020-09-20 NOTE — Addendum Note (Signed)
Addended by: Cydney Ok on: 09/20/2020 09:39 AM   Modules accepted: Orders

## 2020-09-20 NOTE — Telephone Encounter (Signed)
Spoke with the patient, detailed instructions given. He stated that he understood and would be here for his test. Asked to call back with any questions. S.Williams EMTP 

## 2020-09-21 ENCOUNTER — Other Ambulatory Visit (HOSPITAL_COMMUNITY)
Admission: RE | Admit: 2020-09-21 | Discharge: 2020-09-21 | Disposition: A | Discharge status: Home / Self Care | Payer: 59 | Source: Ambulatory Visit | Attending: Cardiovascular Disease | Admitting: Cardiovascular Disease

## 2020-09-21 DIAGNOSIS — Z20822 Contact with and (suspected) exposure to covid-19: Secondary | ICD-10-CM | POA: Diagnosis not present

## 2020-09-21 DIAGNOSIS — Z01812 Encounter for preprocedural laboratory examination: Secondary | ICD-10-CM | POA: Diagnosis not present

## 2020-09-21 LAB — SARS CORONAVIRUS 2 (TAT 6-24 HRS): SARS Coronavirus 2: NEGATIVE

## 2020-09-21 NOTE — Addendum Note (Signed)
Addended by: Sande Rives on: 09/21/2020 12:49 PM   Modules accepted: Orders

## 2020-09-24 MED FILL — TESTOSTERONE CYP 200 MG/ML: 200 | 28 days supply | Qty: 2 | Fill #2

## 2020-09-25 ENCOUNTER — Other Ambulatory Visit: Payer: Self-pay

## 2020-09-25 ENCOUNTER — Ambulatory Visit (HOSPITAL_COMMUNITY): Payer: PRIVATE HEALTH INSURANCE | Attending: Internal Medicine

## 2020-09-25 DIAGNOSIS — Z005 Encounter for examination of potential donor of organ and tissue: Secondary | ICD-10-CM | POA: Insufficient documentation

## 2020-09-25 LAB — MYOCARDIAL PERFUSION IMAGING
Estimated workload: 13.4 METS
Exercise duration (min): 12 min
Exercise duration (sec): 0 s
LV dias vol: 113 mL (ref 62–150)
LV sys vol: 37 mL
MPHR: 165 {beats}/min
Peak HR: 148 {beats}/min
Percent HR: 89 %
Rest HR: 62 {beats}/min
SDS: 0
SRS: 0
SSS: 0
TID: 0.97

## 2020-09-25 MED ORDER — TECHNETIUM TC 99M TETROFOSMIN IV KIT
32.4000 | PACK | Freq: Once | INTRAVENOUS | Status: AC | PRN
  Administered 2020-09-25: 32.4 via INTRAVENOUS
  Filled 2020-09-25: qty 33

## 2020-09-25 MED ORDER — TECHNETIUM TC 99M TETROFOSMIN IV KIT
10.4000 | PACK | Freq: Once | INTRAVENOUS | Status: AC | PRN
  Administered 2020-09-25: 10.4 via INTRAVENOUS
  Filled 2020-09-25: qty 11

## 2020-10-11 ENCOUNTER — Ambulatory Visit (INDEPENDENT_AMBULATORY_CARE_PROVIDER_SITE_OTHER): Payer: PRIVATE HEALTH INSURANCE

## 2020-10-11 ENCOUNTER — Other Ambulatory Visit: Payer: Self-pay

## 2020-10-11 DIAGNOSIS — Z005 Encounter for examination of potential donor of organ and tissue: Secondary | ICD-10-CM | POA: Diagnosis not present

## 2020-10-11 LAB — ECHOCARDIOGRAM COMPLETE
Area-P 1/2: 4.46 cm2
S' Lateral: 2.5 cm

## 2020-10-11 NOTE — Progress Notes (Signed)
Complete echocardiogram performed.  Jimmy Mozella Rexrode RDCS, RVT  

## 2020-10-12 ENCOUNTER — Telehealth: Payer: Self-pay | Admitting: Cardiovascular Disease

## 2020-10-12 NOTE — Telephone Encounter (Signed)
Patient called w/results °

## 2020-10-12 NOTE — Telephone Encounter (Signed)
Sande Rives, MD  10/12/2020 7:55 AM EDT      Please let him know his heart ultrasound was normal. Normal heart function and normal heart valves.  Gerri Spore T. Flora Lipps, MD, [facility]  [facility]  [address], [address] [place], [place] [address] [phone]  7:55 AM

## 2020-10-12 NOTE — Telephone Encounter (Signed)
Pt states he had a recent Echo 10/11/2020 and was told he would receive a call with his results, Pt states no one has reached out to him with his results. Please advise

## 2020-10-17 DIAGNOSIS — Z01818 Encounter for other preprocedural examination: Secondary | ICD-10-CM | POA: Diagnosis not present

## 2020-10-17 DIAGNOSIS — M47816 Spondylosis without myelopathy or radiculopathy, lumbar region: Secondary | ICD-10-CM | POA: Diagnosis not present

## 2020-10-17 DIAGNOSIS — K429 Umbilical hernia without obstruction or gangrene: Secondary | ICD-10-CM | POA: Diagnosis not present

## 2020-10-17 DIAGNOSIS — Z524 Kidney donor: Secondary | ICD-10-CM | POA: Diagnosis not present

## 2020-10-23 ENCOUNTER — Other Ambulatory Visit (HOSPITAL_COMMUNITY): Payer: Self-pay

## 2020-10-23 MED FILL — Testosterone Cypionate IM Inj in Oil 200 MG/ML: INTRAMUSCULAR | 28 days supply | Qty: 2 | Fill #0 | Status: AC

## 2020-10-30 ENCOUNTER — Telehealth: Payer: Self-pay | Admitting: Cardiovascular Disease

## 2020-10-30 NOTE — Telephone Encounter (Signed)
Called patient back to advise of message from MD.  Patient did not answer, LVM to call back.

## 2020-10-30 NOTE — Telephone Encounter (Signed)
Patient returned call- give response from MD. Patient states that he will follow up with his PCP.  No other questions or concerns.

## 2020-10-30 NOTE — Telephone Encounter (Signed)
Wanted to make you aware of his denial, any recommendations for appointment of HTN?

## 2020-10-30 NOTE — Telephone Encounter (Signed)
His blood pressure is well controlled on one medication. I am surprised he was denied for this. His BP was well controlled during my exam. No need to see me. He can see his primary doctor moving forward. All of his heart testing was normal.   Gerri Spore T. Flora Lipps, MD, [facility]  [facility]  [address] [place], [place] [address] [phone]  4:01 PM

## 2020-10-30 NOTE — Telephone Encounter (Signed)
New message:    Patient calling stating that he was trying to be a donor for a kidney transplant for some one and stated they denied  patient due to high BP. Patient calling to see if he need to follow up with the doctor.

## 2020-11-22 ENCOUNTER — Other Ambulatory Visit (HOSPITAL_COMMUNITY): Payer: Self-pay

## 2020-11-22 MED FILL — Testosterone Cypionate IM Inj in Oil 200 MG/ML: INTRAMUSCULAR | 28 days supply | Qty: 2 | Fill #1 | Status: AC

## 2020-12-25 ENCOUNTER — Other Ambulatory Visit (HOSPITAL_COMMUNITY): Payer: Self-pay

## 2020-12-25 ENCOUNTER — Other Ambulatory Visit: Payer: Self-pay | Admitting: Legal Medicine

## 2020-12-25 DIAGNOSIS — E291 Testicular hypofunction: Secondary | ICD-10-CM

## 2020-12-25 MED ORDER — TESTOSTERONE CYPIONATE 200 MG/ML IM SOLN
INTRAMUSCULAR | 3 refills | Status: DC
  Filled 2020-12-25 – 2021-01-01 (×3): qty 6, 84d supply, fill #0
  Filled 2021-01-02: qty 2, 28d supply, fill #0

## 2020-12-26 ENCOUNTER — Other Ambulatory Visit (HOSPITAL_COMMUNITY): Payer: Self-pay

## 2020-12-30 DIAGNOSIS — R059 Cough, unspecified: Secondary | ICD-10-CM | POA: Diagnosis not present

## 2021-01-01 ENCOUNTER — Other Ambulatory Visit (HOSPITAL_COMMUNITY): Payer: Self-pay

## 2021-01-01 ENCOUNTER — Encounter: Payer: Self-pay | Admitting: Nurse Practitioner

## 2021-01-01 ENCOUNTER — Telehealth: Payer: Self-pay

## 2021-01-01 NOTE — Telephone Encounter (Signed)
Patient called and left message wanting call back, called patient back and patient stated that He went to an urgent care over the weekend specifically July 3rd and [place] and the notes are actually in the chart. Patient was seen and his COVID Positive test came back today. The patient stated that his symptoms had started last Thursday which would of been 12/27/2020, and stated that the urgent care where he was seen in [place] did not write him any anti virals, and he believes that he needs antibiotic for the head congestion and nasal congestion he is having. He is normally seen by Dr. Marina Goodell and I told the patient with COVID all we necessarily do is treat the symptoms with OTC meds and or get the anti virals which is exactly what the Urgent care doctor told him as well. Patient still wanted to ask to see if we could send something in for patient? Please advise!

## 2021-01-02 ENCOUNTER — Other Ambulatory Visit (HOSPITAL_COMMUNITY): Payer: Self-pay

## 2021-01-02 ENCOUNTER — Encounter: Payer: Self-pay | Admitting: Nurse Practitioner

## 2021-01-02 ENCOUNTER — Telehealth (INDEPENDENT_AMBULATORY_CARE_PROVIDER_SITE_OTHER): Payer: 59 | Admitting: Nurse Practitioner

## 2021-01-02 DIAGNOSIS — U071 COVID-19: Secondary | ICD-10-CM

## 2021-01-02 DIAGNOSIS — J329 Chronic sinusitis, unspecified: Secondary | ICD-10-CM

## 2021-01-02 DIAGNOSIS — J310 Chronic rhinitis: Secondary | ICD-10-CM | POA: Diagnosis not present

## 2021-01-02 MED ORDER — SALINE SPRAY 0.65 % NA SOLN: 1.0000 | NASAL | 0 refills | Status: DC | PRN

## 2021-01-02 MED ORDER — NIRMATRELVIR/RITONAVIR (PAXLOVID)TABLET: 3.0000 | ORAL_TABLET | Freq: Two times a day (BID) | ORAL | 0 refills | Status: AC

## 2021-01-02 MED ORDER — FLUTICASONE PROPIONATE 50 MCG/ACT NA SUSP: 2.0000 | Freq: Every day | NASAL | 6 refills | Status: DC

## 2021-01-02 NOTE — Progress Notes (Signed)
Virtual Visit via Telephone Note   This visit type was conducted due to national recommendations for restrictions regarding the COVID-19 Pandemic (e.g. social distancing) in an effort to limit this patient's exposure and mitigate transmission in our community.  Due to his co-morbid illnesses, this patient is at least at moderate risk for complications without adequate follow up.  This format is felt to be most appropriate for this patient at this time.  The patient did not have access to video technology/had technical difficulties with video requiring transitioning to audio format only (telephone).  All issues noted in this document were discussed and addressed.  No physical exam could be performed with this format.  Patient verbally consented to a telehealth visit.   Date:  01/02/2021   ID:  [name], DOB May 30, 1966, MRN [id number]  Patient Location: Home Provider Location: Office/Clinic  PCP:  Abigail Miyamoto, MD   Evaluation Performed:  Established patient, telemedicine visit  Chief Complaint:  Sinus congestion  History of Present Illness:    Nathan Hughes is a 55 y.o. male with cough, rhinorrhea, post-nasal-drip, and "itchy" eyes. He was diagnosed with COVID-19 at Urgent Care in [place] on 12/30/20. Onset of symptoms was 7-days ago. Treatment has included Sudafed, Tylenol, and generic Mucinex.   The patient does have symptoms concerning for COVID-19 infection (fever, chills, cough, or new shortness of breath).    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    Past Surgical History:  Procedure Laterality Date   INGUINAL HERNIA REPAIR Right 1997    Family History  Problem Relation Age of Onset   Stroke Father    Heart disease Father    Hypokalemia Sister    Kidney disease Sister     Social History   Socioeconomic History   Marital status: Married    Spouse name: Not on file    Number of children: 2   Years of education: Not on file   Highest education level: Not on file  Occupational History   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[employer]   Occupation: Emergency planning/management officer  Tobacco Use   Smoking status: Never   Smokeless tobacco: Never  Substance and Sexual Activity   Alcohol use: Yes    Alcohol/week: 4.0 standard drinks    Types: 4 Cans of beer per week    Comment: ocassionally   Drug use: Never   Sexual activity: Yes    Partners: Female  Other Topics Concern   Not on file  Social History Narrative   Not on file   Social Determinants of Health   Financial Resource Strain: Not on file  Food Insecurity: Not on file  Transportation Needs: Not on file  Physical Activity: Not on file  Stress: Not on file  Social Connections: Not on file  Intimate Partner Violence: Not on file    Outpatient Medications Prior to Visit  Medication Sig Dispense Refill   losartan (COZAAR) 50 MG tablet Take 1 tablet (50 mg total) by mouth daily. 90 tablet 2   testosterone cypionate (DEPOTESTOSTERONE CYPIONATE) 200 MG/ML injection INJECT 1 ML INTO THE MUSCLE EVERY 2 WEEKS 10 mL 3   No facility-administered medications prior to visit.    Allergies:   Lisinopril   Social History   Tobacco Use   Smoking status: Never   Smokeless tobacco: Never  Substance Use Topics   Alcohol use: Yes    Alcohol/week:  4.0 standard drinks    Types: 4 Cans of beer per week    Comment: ocassionally   Drug use: Never     Review of Systems  Constitutional:  Positive for fever (low-grade). Negative for malaise/fatigue and weight loss.  HENT:  Positive for congestion and sinus pain.        Post-nasal-drip  Eyes: Negative.   Respiratory:  Positive for cough and sputum production. Negative for shortness of breath and wheezing.   Cardiovascular:  Negative for chest pain.  Gastrointestinal: Negative.   Genitourinary: Negative.   Musculoskeletal: Negative.   Skin:  Negative  for rash.  Neurological:  Positive for dizziness and headaches.  Endo/Heme/Allergies: Negative.   Psychiatric/Behavioral: Negative.      Labs/Other Tests and Data Reviewed:    Recent Labs: 04/12/2020: ALT 23; BUN 24; Creatinine, Ser 1.01; Hemoglobin 15.3; Platelets 250; Potassium 5.0; Sodium 138   Recent Lipid Panel Lab Results  Component Value Date/Time   CHOL 189 04/12/2020 08:10 AM   TRIG 61 04/12/2020 08:10 AM   HDL 85 04/12/2020 08:10 AM   CHOLHDL 2.2 04/12/2020 08:10 AM   LDLCALC 93 04/12/2020 08:10 AM    Wt Readings from Last 3 Encounters:  09/25/20 193 lb (87.5 kg)  09/19/20 193 lb 9.6 oz (87.8 kg)  04/12/20 182 lb 9.6 oz (82.8 kg)     Objective:    Vital Signs:  There were no vitals taken for this visit.   Physical Exam No physical exam performed due to telemedicine visit  ASSESSMENT & PLAN:       1. COVID-19 - nirmatrelvir/ritonavir EUA (PAXLOVID) TABS; Take 3 tablets by mouth 2 (two) times daily for 5 days. (Take nirmatrelvir 150 mg two tablets twice daily for 5 days and ritonavir 100 mg one tablet twice daily for 5 days) Patient GFR is 84  Dispense: 30 tablet; Refill: 0  2. Rhinosinusitis - fluticasone (FLONASE) 50 MCG/ACT nasal spray; Place 2 sprays into both nostrils daily.  Dispense: 16 g; Refill: 6 - sodium chloride (OCEAN) 0.65 % SOLN nasal spray; Place 1 spray into both nostrils as needed for congestion.  Dispense: 44 mL; Refill: 0   Rest and push fluids Seek emergency medical care for worsening symptoms   COVID-19 Education: The signs and symptoms of COVID-19 were discussed with the patient and how to seek care for testing (follow up with PCP or arrange E-visit). The importance of social distancing was discussed today.   I spent 12 minutes dedicated to the care of this patient on the date of this encounter to include face-to-face time with the patient, as well as: EMR and prescription medication management  Follow Up:  In Person  prn  Signed,  Janie Morning, NP  01/02/2021 3:28 PM    [facility]

## 2021-01-04 ENCOUNTER — Other Ambulatory Visit: Payer: Self-pay

## 2021-01-04 DIAGNOSIS — J310 Chronic rhinitis: Secondary | ICD-10-CM

## 2021-01-04 MED ORDER — AMOXICILLIN-POT CLAVULANATE 875-125 MG PO TABS: 1.0000 | ORAL_TABLET | Freq: Two times a day (BID) | ORAL | 0 refills | Status: DC

## 2021-01-16 ENCOUNTER — Other Ambulatory Visit: Payer: Self-pay

## 2021-01-16 ENCOUNTER — Encounter: Payer: Self-pay | Admitting: Legal Medicine

## 2021-01-16 ENCOUNTER — Ambulatory Visit: Payer: 59 | Admitting: Legal Medicine

## 2021-01-16 VITALS — BP 122/80 | HR 81 | Temp 97.3°F | Ht 69.0 in | Wt 188.6 lb

## 2021-01-16 DIAGNOSIS — I10 Essential (primary) hypertension: Secondary | ICD-10-CM

## 2021-01-16 DIAGNOSIS — E291 Testicular hypofunction: Secondary | ICD-10-CM | POA: Diagnosis not present

## 2021-01-16 DIAGNOSIS — Z6828 Body mass index (BMI) 28.0-28.9, adult: Secondary | ICD-10-CM

## 2021-01-16 MED ORDER — TESTOSTERONE CYPIONATE 200 MG/ML IM SOLN: INTRAMUSCULAR | 3 refills | Status: DC

## 2021-01-16 NOTE — Progress Notes (Signed)
Established Patient Office Visit  Subjective:  Patient ID: Nathan Hughes, male    DOB: 01/10/1966  Age: 55 y.o. MRN: [id number]  CC:  Chief Complaint  Patient presents with   Hypertension     HPI Nathan Hughes presents for hypertension  Patient presents for follow up of hypertension.  Patient tolerating losartan well with side effects.  Patient was diagnosed with hypertension 2010 so has been treated for hypertension for 10 years.Patient is working on maintaining diet and exercise regimen and follows up as directed. Complication include none.   He is not a kidney donor now.  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    Past Surgical History:  Procedure Laterality Date   INGUINAL HERNIA REPAIR Right 1997    Family History  Problem Relation Age of Onset   Stroke Father    Heart disease Father    Hypokalemia Sister    Kidney disease Sister     Social History   Socioeconomic History   Marital status: Married    Spouse name: Not on file   Number of children: 2   Years of education: Not on file   Highest education level: Not on file  Occupational History   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[employer]   Occupation: Emergency planning/management officer  Tobacco Use   Smoking status: Never   Smokeless tobacco: Never  Substance and Sexual Activity   Alcohol use: Yes    Alcohol/week: 4.0 standard drinks    Types: 4 Cans of beer per week    Comment: ocassionally   Drug use: Never   Sexual activity: Yes    Partners: Female  Other Topics Concern   Not on file  Social History Narrative   Not on file   Social Determinants of Health   Financial Resource Strain: Not on file  Food Insecurity: Not on file  Transportation Needs: Not on file  Physical Activity: Not on file  Stress: Not on file  Social Connections: Not on file  Intimate  Partner Violence: Not on file    Outpatient Medications Prior to Visit  Medication Sig Dispense Refill   aspirin 81 MG EC tablet one time daily.     fluticasone (FLONASE) 50 MCG/ACT nasal spray Place 2 sprays into both nostrils daily. 16 g 6   ibuprofen (ADVIL) 200 MG tablet as needed.     losartan (COZAAR) 50 MG tablet Take 1 tablet (50 mg total) by mouth daily. 90 tablet 2   Omega-3 Fatty Acids (FISH OIL) 1000 MG CAPS one time daily.     sodium chloride (OCEAN) 0.65 % SOLN nasal spray Place 1 spray into both nostrils as needed for congestion. 44 mL 0   Vitamins/Minerals TABS 1 tab one time daily.     testosterone cypionate (DEPOTESTOSTERONE CYPIONATE) 200 MG/ML injection INJECT 1 ML INTO THE MUSCLE EVERY 2 WEEKS 10 mL 3   amoxicillin-clavulanate (AUGMENTIN) 875-125 MG tablet Take 1 tablet by mouth 2 (two) times daily. 20 tablet 0   No facility-administered medications prior to visit.    Allergies  Allergen Reactions   Lisinopril Cough    ROS Review of Systems  Constitutional:  Negative for chills, fatigue and fever.  HENT:  Negative for congestion, ear pain and sore throat.   Respiratory:  Negative for cough and shortness of breath.   Cardiovascular:  Negative for  chest pain.  Gastrointestinal:  Negative for abdominal pain, constipation, diarrhea, nausea and vomiting.  Endocrine: Negative for polydipsia, polyphagia and polyuria.  Genitourinary:  Negative for dysuria and frequency.  Musculoskeletal:  Negative for arthralgias and myalgias.  Neurological:  Negative for dizziness and headaches.  Psychiatric/Behavioral:  Negative for dysphoric mood.        No dysphoria     Objective:    Physical Exam  BP 122/80 (BP Location: Right Arm, Patient Position: Sitting, Cuff Size: Normal)   Pulse 81   Temp (!) 97.3 F (36.3 C) (Temporal)   Ht 5\' 9"  (1.753 m)   Wt 188 lb 9.6 oz (85.5 kg)   SpO2 99%   BMI 27.85 kg/m  Wt Readings from Last 3 Encounters:  01/16/21 188 lb 9.6 oz  (85.5 kg)  09/25/20 193 lb (87.5 kg)  09/19/20 193 lb 9.6 oz (87.8 kg)     Health Maintenance Due  Topic Date Due   HIV Screening  Never done   Hepatitis C Screening  Never done   TETANUS/TDAP  Never done   Zoster Vaccines- Shingrix (1 of 2) Never done   COVID-19 Vaccine (4 - Booster for Moderna series) 11/09/2020    There are no preventive care reminders to display for this patient.  No results found for: TSH Lab Results  Component Value Date   WBC 4.7 04/12/2020   HGB 15.3 04/12/2020   HCT 44.9 04/12/2020   MCV 96 04/12/2020   PLT 250 04/12/2020   Lab Results  Component Value Date   NA 138 04/12/2020   K 5.0 04/12/2020   CO2 25 04/12/2020   GLUCOSE 89 04/12/2020   BUN 24 04/12/2020   CREATININE 1.01 04/12/2020   BILITOT 2.6 (H) 04/12/2020   ALKPHOS 51 04/12/2020   AST 22 04/12/2020   ALT 23 04/12/2020   PROT 7.5 04/12/2020   ALBUMIN 4.8 04/12/2020   CALCIUM 10.0 04/12/2020   Lab Results  Component Value Date   CHOL 189 04/12/2020   Lab Results  Component Value Date   HDL 85 04/12/2020   Lab Results  Component Value Date   LDLCALC 93 04/12/2020   Lab Results  Component Value Date   TRIG 61 04/12/2020   Lab Results  Component Value Date   CHOLHDL 2.2 04/12/2020   No results found for: HGBA1C    Assessment & Plan:   Problem List Items Addressed This Visit       Cardiovascular and Mediastinum   Benign essential hypertension - Primary   Relevant Medications   aspirin 81 MG EC tablet An individual hypertension care plan was established and reinforced today.  The patient's status was assessed using clinical findings on exam and labs or diagnostic tests. The patient's success at meeting treatment goals on disease specific evidence-based guidelines and found to be well controlled. SELF MANAGEMENT: The patient and I together assessed ways to personally work towards obtaining the recommended goals. RECOMMENDATIONS: avoid decongestants found in common  cold remedies, decrease consumption of alcohol, perform routine monitoring of BP with home BP cuff, exercise, reduction of dietary salt, take medicines as prescribed, try not to miss doses and quit smoking.  Regular exercise and maintaining a healthy weight is needed.  Stress reduction may help. A CLINICAL SUMMARY including written plan identify barriers to care unique to individual due to social or financial issues.  We attempt to mutually creat solutions for individual and family understanding.      Endocrine   Testicular hypofunction  Continue treatment with injectable testsosterone Check lab     Other   BMI 28.0-28.9,adult An individualize plan was formulated for obesity using patient history and physical exam to encourage weight loss.  An evidence based program was formulated.  Patient is to cut portion size with meals and to plan physical exercise 3 days a week at least 20 minutes.  Weight watchers and other programs are helpful.  Planned amount of weight loss 10 lbs.        Follow-up: Return in about 1 year (around 01/16/2022) for fasting.    Brent Bulla, MD

## 2021-01-17 ENCOUNTER — Other Ambulatory Visit: Payer: Self-pay

## 2021-01-17 DIAGNOSIS — N400 Enlarged prostate without lower urinary tract symptoms: Secondary | ICD-10-CM

## 2021-01-17 LAB — CBC WITH DIFFERENTIAL/PLATELET
Basophils Absolute: 0.1 10*3/uL (ref 0.0–0.2)
Basos: 2 %
EOS (ABSOLUTE): 0.1 10*3/uL (ref 0.0–0.4)
Eos: 3 %
Hematocrit: 41.8 % (ref 37.5–51.0)
Hemoglobin: 14.3 g/dL (ref 13.0–17.7)
Immature Grans (Abs): 0 10*3/uL (ref 0.0–0.1)
Immature Granulocytes: 0 %
Lymphocytes Absolute: 1.5 10*3/uL (ref 0.7–3.1)
Lymphs: 38 %
MCH: 31.8 pg (ref 26.6–33.0)
MCHC: 34.2 g/dL (ref 31.5–35.7)
MCV: 93 fL (ref 79–97)
Monocytes Absolute: 0.6 10*3/uL (ref 0.1–0.9)
Monocytes: 14 %
Neutrophils Absolute: 1.8 10*3/uL (ref 1.4–7.0)
Neutrophils: 43 %
Platelets: 284 10*3/uL (ref 150–450)
RBC: 4.49 x10E6/uL (ref 4.14–5.80)
RDW: 11.8 % (ref 11.6–15.4)
WBC: 4.1 10*3/uL (ref 3.4–10.8)

## 2021-01-17 LAB — COMPREHENSIVE METABOLIC PANEL
ALT: 28 IU/L (ref 0–44)
AST: 25 IU/L (ref 0–40)
Albumin/Globulin Ratio: 1.6 (ref 1.2–2.2)
Albumin: 4.4 g/dL (ref 3.8–4.9)
Alkaline Phosphatase: 55 IU/L (ref 44–121)
BUN/Creatinine Ratio: 18 (ref 9–20)
BUN: 13 mg/dL (ref 6–24)
Bilirubin Total: 0.9 mg/dL (ref 0.0–1.2)
CO2: 24 mmol/L (ref 20–29)
Calcium: 9.4 mg/dL (ref 8.7–10.2)
Chloride: 98 mmol/L (ref 96–106)
Creatinine, Ser: 0.71 mg/dL — ABNORMAL LOW (ref 0.76–1.27)
Globulin, Total: 2.7 g/dL (ref 1.5–4.5)
Glucose: 86 mg/dL (ref 65–99)
Potassium: 4.7 mmol/L (ref 3.5–5.2)
Sodium: 135 mmol/L (ref 134–144)
Total Protein: 7.1 g/dL (ref 6.0–8.5)
eGFR: 108 mL/min/{1.73_m2} (ref >59)

## 2021-01-17 LAB — TESTOSTERONE FREE, PROFILE I
Sex Hormone Binding: 62.2 nmol/L (ref 19.3–76.4)
Testost., Free, Calc: 54 pg/mL (ref 35.8–168.2)
Testosterone: 412 ng/dL (ref 264–916)

## 2021-01-17 LAB — LIPID PANEL
Chol/HDL Ratio: 2.3 ratio (ref 0.0–5.0)
Cholesterol, Total: 166 mg/dL (ref 100–199)
HDL: 73 mg/dL (ref >39)
LDL Chol Calc (NIH): 81 mg/dL (ref 0–99)
Triglycerides: 58 mg/dL (ref 0–149)
VLDL Cholesterol Cal: 12 mg/dL (ref 5–40)

## 2021-01-17 LAB — PSA: Prostate Specific Ag, Serum: 6.5 ng/mL — ABNORMAL HIGH (ref 0.0–4.0)

## 2021-01-17 LAB — CARDIOVASCULAR RISK ASSESSMENT

## 2021-01-17 NOTE — Progress Notes (Signed)
CBC normal, kidney and liver tests normal, Cholesterol normal, testosterone 412, free tests 54 normal levels, PSA 6.5 need to see urology for high PSA lp

## 2021-01-31 ENCOUNTER — Other Ambulatory Visit: Payer: Self-pay | Admitting: Legal Medicine

## 2021-01-31 ENCOUNTER — Other Ambulatory Visit (HOSPITAL_COMMUNITY): Payer: Self-pay

## 2021-01-31 DIAGNOSIS — E291 Testicular hypofunction: Secondary | ICD-10-CM

## 2021-01-31 MED ORDER — TESTOSTERONE CYPIONATE 200 MG/ML IM SOLN
200.0000 mg | INTRAMUSCULAR | 3 refills | Status: DC
Start: 2021-01-31 — End: 2021-09-25
  Filled 2021-01-31 – 2021-02-13 (×2): qty 6, 84d supply, fill #0
  Filled 2021-06-04: qty 6, 84d supply, fill #1

## 2021-02-04 ENCOUNTER — Telehealth: Payer: Self-pay

## 2021-02-04 ENCOUNTER — Other Ambulatory Visit (HOSPITAL_COMMUNITY): Payer: Self-pay

## 2021-02-04 NOTE — Telephone Encounter (Signed)
Testerone Cypionate 200 mg/ml injection approved by prior auth.

## 2021-02-13 ENCOUNTER — Other Ambulatory Visit (HOSPITAL_COMMUNITY): Payer: Self-pay

## 2021-03-05 DIAGNOSIS — R972 Elevated prostate specific antigen [PSA]: Secondary | ICD-10-CM | POA: Diagnosis not present

## 2021-03-05 DIAGNOSIS — N401 Enlarged prostate with lower urinary tract symptoms: Secondary | ICD-10-CM | POA: Diagnosis not present

## 2021-04-15 ENCOUNTER — Encounter: Payer: 59 | Admitting: Legal Medicine

## 2021-04-24 ENCOUNTER — Other Ambulatory Visit: Payer: Self-pay | Admitting: Legal Medicine

## 2021-04-24 DIAGNOSIS — I10 Essential (primary) hypertension: Secondary | ICD-10-CM

## 2021-05-07 ENCOUNTER — Other Ambulatory Visit: Payer: Self-pay

## 2021-05-07 ENCOUNTER — Encounter: Payer: Self-pay | Admitting: Legal Medicine

## 2021-05-07 ENCOUNTER — Ambulatory Visit (INDEPENDENT_AMBULATORY_CARE_PROVIDER_SITE_OTHER): Payer: 59 | Admitting: Legal Medicine

## 2021-05-07 VITALS — BP 140/90 | HR 80 | Temp 96.8°F | Ht 69.0 in | Wt 195.0 lb

## 2021-05-07 DIAGNOSIS — Z1159 Encounter for screening for other viral diseases: Secondary | ICD-10-CM

## 2021-05-07 DIAGNOSIS — Z23 Encounter for immunization: Secondary | ICD-10-CM

## 2021-05-07 DIAGNOSIS — M25551 Pain in right hip: Secondary | ICD-10-CM | POA: Diagnosis not present

## 2021-05-07 DIAGNOSIS — E291 Testicular hypofunction: Secondary | ICD-10-CM | POA: Diagnosis not present

## 2021-05-07 DIAGNOSIS — I10 Essential (primary) hypertension: Secondary | ICD-10-CM

## 2021-05-07 DIAGNOSIS — M2550 Pain in unspecified joint: Secondary | ICD-10-CM | POA: Diagnosis not present

## 2021-05-07 NOTE — Progress Notes (Signed)
Subjective:  Patient ID: Nathan Hughes, male    DOB: 1966-04-30  Age: 55 y.o. MRN: [id number]  Chief Complaint  Patient presents with   Annual Exam    HPI  Well Adult Physical: Patient here for a comprehensive physical exam.The patient reports problems - left hip pops for 2 months since crawling ain crawl space Do you tpake any herbs or supplements that were not prescribed by a doctor? no Are you taking calcium supplements? no Are you taking aspirin daily? no  Encounter for general adult medical examination without abnormal findings  Physical ("At Risk" items are starred): Patient's last physical exam was 1 year ago .  Patient wears a seat belt, has smoke detectors, has carbon monoxide detectors, practices appropriate gun safety, and wears sunscreen with extended sun exposure. Dental Care: biannual cleanings, brushes and flosses daily. Ophthalmology/Optometry: Annual visit.  Hearing loss: none Vision impairments: none Last PSA:1 year  Patient presents for follow up of hypertension.  Patient tolerating losartan well with side effects.  Patient was diagnosed with hypertension 2010 so has been treated for hypertension for 12 years.Patient is working on maintaining diet and exercise regimen and follows up as directed. Complication include none  On testosterone supplements.   Flowsheet Row Office Visit from 05/07/2021 in [facility]  PHQ-2 Total Score 0               Social History   Socioeconomic History   Marital status: Married    Spouse name: Not on file   Number of children: 2   Years of education: Not on file   Highest education level: Not on file  Occupational History   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[employer]   Occupation: Emergency planning/management officer  Tobacco Use   Smoking status: Never   Smokeless tobacco: Never  Substance and Sexual Activity   Alcohol use: Yes    Alcohol/week: 4.0 standard drinks    Types: 4 Cans of beer per week    Comment:  ocassionally   Drug use: Never   Sexual activity: Yes    Partners: Female  Other Topics Concern   Not on file  Social History Narrative   Not on file   Social Determinants of Health   Financial Resource Strain: Not on file  Food Insecurity: Not on file  Transportation Needs: Not on file  Physical Activity: Not on file  Stress: Not on file  Social Connections: Not on file   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   Past Surgical History:  Procedure Laterality Date   INGUINAL HERNIA REPAIR Right 1997    Family History  Problem Relation Age of Onset   Stroke Father    Heart disease Father    Hypokalemia Sister    Kidney disease Sister    Social History   Socioeconomic History   Marital status: Married    Spouse name: Not on file   Number of children: 2   Years of education: Not on file   Highest education level: Not on file  Occupational History   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[employer]   Occupation: Emergency planning/management officer  Tobacco Use   Smoking status: Never   Smokeless tobacco: Never  Substance and Sexual Activity   Alcohol use: Yes    Alcohol/week: 4.0 standard drinks    Types: 4 Cans of beer per week    Comment:  ocassionally   Drug use: Never   Sexual activity: Yes    Partners: Female  Other Topics Concern   Not on file  Social History Narrative   Not on file   Social Determinants of Health   Financial Resource Strain: Not on file  Food Insecurity: Not on file  Transportation Needs: Not on file  Physical Activity: Not on file  Stress: Not on file  Social Connections: Not on file   Review of Systems  Constitutional:  Negative for chills, fatigue, fever and unexpected weight change.  HENT:  Negative for congestion, ear pain, sinus pain and sore throat.   Cardiovascular:  Negative for chest pain and palpitations.  Gastrointestinal:  Negative for abdominal  pain, blood in stool, constipation, diarrhea, nausea and vomiting.  Endocrine: Negative for polydipsia.  Genitourinary:  Negative for dysuria.  Musculoskeletal:  Positive for arthralgias (Right Hip pain). Negative for back pain.  Skin:  Negative for rash.  Neurological:  Negative for headaches.    Objective:  BP 140/90 Comment: patient did not take his blood pressure before to come  Pulse 80   Temp (!) 96.8 F (36 C)   Ht 5\' 9"  (1.753 m)   Wt 195 lb (88.5 kg)   SpO2 98%   BMI 28.80 kg/m   BP/Weight 05/07/2021 01/16/2021 09/19/2020  Systolic BP 140 122 132  Diastolic BP 90 80 84  Wt. (Lbs) 195 188.6 193.6  BMI 28.8 27.85 28.59    Physical Exam Vitals reviewed.  Constitutional:      General: He is not in acute distress.    Appearance: Normal appearance.  HENT:     Head: Normocephalic.     Right Ear: Tympanic membrane, ear canal and external ear normal.     Left Ear: Tympanic membrane, ear canal and external ear normal.     Mouth/Throat:     Mouth: Mucous membranes are moist.     Pharynx: Oropharynx is clear.  Eyes:     Extraocular Movements: Extraocular movements intact.     Conjunctiva/sclera: Conjunctivae normal.     Pupils: Pupils are equal, round, and reactive to light.  Cardiovascular:     Rate and Rhythm: Normal rate and regular rhythm.     Pulses: Normal pulses.     Heart sounds: Normal heart sounds. No murmur heard.   No gallop.  Pulmonary:     Effort: Pulmonary effort is normal. No respiratory distress.     Breath sounds: Normal breath sounds. No wheezing.  Abdominal:     General: Abdomen is flat. Bowel sounds are normal. There is no distension.     Palpations: Abdomen is soft.     Tenderness: There is no abdominal tenderness.  Musculoskeletal:        General: Normal range of motion.     Cervical back: Normal range of motion.     Right lower leg: No edema.     Left lower leg: No edema.       Legs:     Comments: Pain medial hip  Skin:    General: Skin  is warm.     Capillary Refill: Capillary refill takes less than 2 seconds.  Neurological:     General: No focal deficit present.     Mental Status: He is alert and oriented to person, place, and time. Mental status is at baseline.     Deep Tendon Reflexes: Reflexes normal.  Psychiatric:        Mood and Affect: Mood normal.  Thought Content: Thought content normal.    Lab Results  Component Value Date   WBC 4.1 01/16/2021   HGB 14.3 01/16/2021   HCT 41.8 01/16/2021   PLT 284 01/16/2021   GLUCOSE 86 01/16/2021   CHOL 166 01/16/2021   TRIG 58 01/16/2021   HDL 73 01/16/2021   LDLCALC 81 01/16/2021   ALT 28 01/16/2021   AST 25 01/16/2021   NA 135 01/16/2021   K 4.7 01/16/2021   CL 98 01/16/2021   CREATININE 0.71 (L) 01/16/2021   BUN 13 01/16/2021   CO2 24 01/16/2021      Assessment & Plan:   Problem List Items Addressed This Visit       Cardiovascular and Mediastinum   Benign essential hypertension   Relevant Orders   CBC with Differential/Platelet   Comprehensive metabolic panel An individual hypertension care plan was established and reinforced today.  The patient's status was assessed using clinical findings on exam and labs or diagnostic tests. The patient's success at meeting treatment goals on disease specific evidence-based guidelines and found to be well controlled. SELF MANAGEMENT: The patient and I together assessed ways to personally work towards obtaining the recommended goals. RECOMMENDATIONS: avoid decongestants found in common cold remedies, decrease consumption of alcohol, perform routine monitoring of BP with home BP cuff, exercise, reduction of dietary salt, take medicines as prescribed, try not to miss doses and quit smoking.  Regular exercise and maintaining a healthy weight is needed.  Stress reduction may help. A CLINICAL SUMMARY including written plan identify barriers to care unique to individual due to social or financial issues.  We attempt to  mutually creat solutions for individual and family understanding.      Endocrine   Testicular hypofunction   Relevant Orders   Testosterone Free, Profile I AN INDIVIDUAL CARE PLAN for hypogonadism was established and reinforced today.  The patient's status was assessed using clinical findings on exam, labs, and other diagnostic testing. Patient's success at meeting treatment goals based on disease specific evidence-bassed guidelines and found to be in good control. RECOMMENDATIONS include maintaining present medicines and treatment.    Other Visit Diagnoses     Encounter for hepatitis C screening test for low risk patient    -  Primary   Relevant Orders   Hepatitis C Antibody Screening for hepatitis C    Right hip pain       Relevant Orders   Ambulatory referral to Sports Medicine Recurrent pain in right groin with movement of hip on right.  He denies injury, no hernia seen.    Arthralgia of multiple joints       Relevant Orders   Lyme Disease, Western Blot   Rocky mtn spotted fvr ab, IgG-blood Patient has vague arthralgia and had a recent tick bite but no rashes   Encounter for immunization       Relevant Orders   Flu Vaccine MDCK QUAD PF (Completed)       Body mass index is 28.8 kg/m.   These are the goals we discussed:  Goals      Activity and Exercise Increased     Evidence-based guidance:  Review current exercise levels.  Assess patient perspective on exercise or activity level, barriers to increasing activity, motivation and readiness for change.  Recommend or set healthy exercise goal based on individual tolerance.  Encourage small steps toward making change in amount of exercise or activity.  Urge reduction of sedentary activities or screen time.  Promote group  activities within the community or with family or support person.  Consider referral to rehabiliation therapist for assessment and exercise/activity plan.   Notes:          This is a list of the  screening recommended for you and due dates:  Health Maintenance  Topic Date Due   HIV Screening  Never done   Hepatitis C Screening: USPSTF Recommendation to screen - Ages 43-79 yo.  Never done   Tetanus Vaccine  Never done   Zoster (Shingles) Vaccine (1 of 2) Never done   COVID-19 Vaccine (4 - Booster for Moderna series) 09/06/2020   Colon Cancer Screening  10/31/2028   Flu Shot  Completed   Pneumococcal Vaccination  Aged Out   HPV Vaccine  Aged Out         Follow-up: Return in about 6 months (around 11/04/2021) for fasting.  An After Visit Summary was printed and given to the patient.  Brent Bulla, MD [facility] [phone]

## 2021-05-08 LAB — COMPREHENSIVE METABOLIC PANEL
ALT: 25 IU/L (ref 0–44)
AST: 24 IU/L (ref 0–40)
Albumin/Globulin Ratio: 1.5 (ref 1.2–2.2)
Albumin: 4.6 g/dL (ref 3.8–4.9)
Alkaline Phosphatase: 50 IU/L (ref 44–121)
BUN/Creatinine Ratio: 17 (ref 9–20)
BUN: 13 mg/dL (ref 6–24)
Bilirubin Total: 1.4 mg/dL — ABNORMAL HIGH (ref 0.0–1.2)
CO2: 27 mmol/L (ref 20–29)
Calcium: 9.8 mg/dL (ref 8.7–10.2)
Chloride: 100 mmol/L (ref 96–106)
Creatinine, Ser: 0.76 mg/dL (ref 0.76–1.27)
Globulin, Total: 3.1 g/dL (ref 1.5–4.5)
Glucose: 87 mg/dL (ref 70–99)
Potassium: 4.4 mmol/L (ref 3.5–5.2)
Sodium: 138 mmol/L (ref 134–144)
Total Protein: 7.7 g/dL (ref 6.0–8.5)
eGFR: 106 mL/min/{1.73_m2} (ref >59)

## 2021-05-08 LAB — CBC WITH DIFFERENTIAL/PLATELET
Basophils Absolute: 0.1 10*3/uL (ref 0.0–0.2)
Basos: 1 %
EOS (ABSOLUTE): 0.1 10*3/uL (ref 0.0–0.4)
Eos: 3 %
Hematocrit: 45.7 % (ref 37.5–51.0)
Hemoglobin: 15.4 g/dL (ref 13.0–17.7)
Immature Grans (Abs): 0 10*3/uL (ref 0.0–0.1)
Immature Granulocytes: 1 %
Lymphocytes Absolute: 1.3 10*3/uL (ref 0.7–3.1)
Lymphs: 30 %
MCH: 32.3 pg (ref 26.6–33.0)
MCHC: 33.7 g/dL (ref 31.5–35.7)
MCV: 96 fL (ref 79–97)
Monocytes Absolute: 0.5 10*3/uL (ref 0.1–0.9)
Monocytes: 12 %
Neutrophils Absolute: 2.2 10*3/uL (ref 1.4–7.0)
Neutrophils: 53 %
Platelets: 284 10*3/uL (ref 150–450)
RBC: 4.77 x10E6/uL (ref 4.14–5.80)
RDW: 11.3 % — ABNORMAL LOW (ref 11.6–15.4)
WBC: 4.1 10*3/uL (ref 3.4–10.8)

## 2021-05-08 LAB — TESTOSTERONE FREE, PROFILE I
Sex Hormone Binding: 58.6 nmol/L (ref 19.3–76.4)
Testost., Free, Calc: 70.8 pg/mL (ref 35.8–168.2)
Testosterone: 510 ng/dL (ref 264–916)

## 2021-05-08 LAB — HEPATITIS C ANTIBODY: Hep C Virus Ab: <0.1 s/co ratio (ref 0.0–0.9)

## 2021-05-08 NOTE — Progress Notes (Signed)
Hepatitis c negative screening test. Cbc normal, kidney and liver tests normal, free testosterone 70.8 normal lp

## 2021-05-10 ENCOUNTER — Ambulatory Visit (INDEPENDENT_AMBULATORY_CARE_PROVIDER_SITE_OTHER): Payer: 59 | Admitting: Family Medicine

## 2021-05-10 ENCOUNTER — Ambulatory Visit
Admission: RE | Admit: 2021-05-10 | Discharge: 2021-05-10 | Disposition: A | Discharge status: Home / Self Care | Payer: 59 | Source: Ambulatory Visit | Attending: Family Medicine | Admitting: Family Medicine

## 2021-05-10 ENCOUNTER — Encounter: Payer: Self-pay | Admitting: Family Medicine

## 2021-05-10 VITALS — BP 132/82 | Ht 69.0 in | Wt 195.0 lb

## 2021-05-10 DIAGNOSIS — M25551 Pain in right hip: Secondary | ICD-10-CM

## 2021-05-10 LAB — LYME DISEASE, WESTERN BLOT
IgG P18 Ab.: ABSENT
IgG P23 Ab.: ABSENT
IgG P28 Ab.: ABSENT
IgG P30 Ab.: ABSENT
IgG P39 Ab.: ABSENT
IgG P41 Ab.: ABSENT
IgG P45 Ab.: ABSENT
IgG P58 Ab.: ABSENT
IgG P66 Ab.: ABSENT
IgG P93 Ab.: ABSENT
IgM P23 Ab.: ABSENT
IgM P39 Ab.: ABSENT
IgM P41 Ab.: ABSENT
Lyme IgG Wb: NEGATIVE
Lyme IgM Wb: NEGATIVE

## 2021-05-10 LAB — ROCKY MTN SPOTTED FVR AB, IGG-BLOOD: RMSF IgG: NEGATIVE

## 2021-05-10 MED ORDER — MELOXICAM 15 MG PO TABS: ORAL_TABLET | ORAL | 0 refills | Status: DC

## 2021-05-10 NOTE — Assessment & Plan Note (Signed)
Most likely hip flexor and adductor strain, but could also consider early hip OA.  Will obtain x-rays to further assess.  Will trial meloxicam scheduled daily for 1 week, then as needed.  Advise no other NSAID use while taking.  He has not had improvement from Voltaren gel in the past.  We will also give home exercise program for hip flexion and hip adduction.  We will have him follow-up in 4 to 6 weeks.  Did discuss in the future that if is not improving and there is signs of OA, could consider femoral acetabular CSI.  Could also consider tendinopathy treatments including shockwave therapy and PRP which were discussed today.

## 2021-05-10 NOTE — Patient Instructions (Signed)
Thank you for coming to see me today. It was a pleasure. Today we talked about:   I have placed an order for hip x-rays.  Please go to [facility] to have this completed.  You do not need an appointment.  We will contact you with your results afterwards.   Take meloxicam I have sent scheduled for a week, then as needed Do not take this with ibuprofen, Advil, Aleve.  You can take this with Tylenol.  You can also ice your hip at the end of the day for about 15 minutes.  The exercises that we have given you.  Aim for 4-5 times a week.  Please follow-up with [place] in 4 to 6 weeks.  If you have any questions or concerns, please do not hesitate to call the office at [phone].  Best,   Luis Abed, DO [facility]

## 2021-05-10 NOTE — Progress Notes (Signed)
[facility]: Attending Note: I have reviewed the chart, discussed wit the Sports Medicine Fellow. I agree with assessment and treatment plan as detailed in the Fellow's note.  

## 2021-05-10 NOTE — Progress Notes (Signed)
   Nathan Hughes is a 55 y.o. 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 and thinks this might have exacerbated the pain Walks twice daily with his dogs and also does weightlifting Has been having pain with doing weights in his legs Has been taking ibuprofen 800 mg twice daily which helps some, but he does not want to take a significant amount of NSAIDs as his sister had a kidney transplant, although he has normal kidney function currently States that pain worsens when he is lying on his side and it needs to keep a pillow between his legs Denies any prior injuries Does report a history of lumbar spine degenerative disc disease, but has no posterior pain   PMH reviewed.  ROS as above. Medications reviewed.  Exam:  BP 132/82   Ht 5\' 9"  (1.753 m)   Wt 195 lb (88.5 kg)   BMI 28.80 kg/m  Gen: Well NAD MSK:  Right Hip:  - Inspection: No gross deformity, no swelling, erythema, or ecchymosis b/l - Palpation: TTP at adductor tendon - ROM: Normal range of motion on Flexion, extension, abduction, internal and external rotation b/l, some pain with IR - Strength: Normal strength in all fields b/l, but pain with resisted hip flexion and hip adduction - Neuro/vasc: NV intact distally b/l - Special Tests: Positive FABER and FADIR b/l.     No results found.   Assessment and Plan: 1) Right hip pain Most likely hip flexor and adductor strain, but could also consider early hip OA.  Will obtain x-rays to further assess.  Will trial meloxicam scheduled daily for 1 week, then as needed.  Advise no other NSAID use while taking.  He has not had improvement from Voltaren gel in the past.  We will also give home exercise program for hip flexion and hip adduction.  We will have him follow-up in 4 to 6 weeks.  Did discuss in the future that if is not improving and there is signs of  OA, could consider femoral acetabular CSI.  Could also consider tendinopathy treatments including shockwave therapy and PRP which were discussed today.   , D.O.  PGY-4 [facility]  05/10/2021 12:02 PM

## 2021-05-12 NOTE — Progress Notes (Signed)
Lyme test negative lp

## 2021-06-04 ENCOUNTER — Ambulatory Visit (INDEPENDENT_AMBULATORY_CARE_PROVIDER_SITE_OTHER): Payer: 59 | Admitting: Sports Medicine

## 2021-06-04 ENCOUNTER — Other Ambulatory Visit (HOSPITAL_COMMUNITY): Payer: Self-pay

## 2021-06-04 ENCOUNTER — Ambulatory Visit
Admission: RE | Admit: 2021-06-04 | Discharge: 2021-06-04 | Disposition: A | Discharge status: Home / Self Care | Payer: 59 | Source: Ambulatory Visit | Attending: Sports Medicine | Admitting: Sports Medicine

## 2021-06-04 VITALS — Ht 69.0 in | Wt 190.0 lb

## 2021-06-04 DIAGNOSIS — M25462 Effusion, left knee: Secondary | ICD-10-CM | POA: Diagnosis not present

## 2021-06-04 DIAGNOSIS — M25562 Pain in left knee: Secondary | ICD-10-CM | POA: Diagnosis not present

## 2021-06-04 MED ORDER — DICLOFENAC SODIUM 75 MG PO TBEC: DELAYED_RELEASE_TABLET | ORAL | 0 refills | Status: DC

## 2021-06-04 NOTE — Assessment & Plan Note (Signed)
Pt is a 55 yo active male with 8 days of left knee pain. OA vs possible old ACL injury. Doubt prepatellar bursitis. Discussed treatment options. Recommend compression sleeve with activity,  Voltaren tabs BID for 7d then PRN. Obtain knee xrays. Advised to obtain standing desk to increase mobility. Discontinue Mobic. Follow up in 2 weeks.

## 2021-06-04 NOTE — Progress Notes (Addendum)
   SUBJECTIVE:   CHIEF COMPLAINT / HPI:   Nathan Hughes is a 55 y.o. male here for left knee pain. Pain started 8 days ago after waking up to knee swelling and pain. Reports the day prior to pain onset he was kneeling down for 15-20 minutes. Swelling has improved with ice. Pain worse with taking stairs and walking the dog. Had previous injury several years ago while in [place] that resolved without treatment. He denies popping, clicking or catching of his knee.    Pt reports improvement in his right hip pain with NSAIDs and home exercises.     PERTINENT  PMH / PSH: reviewed and updated as appropriate   OBJECTIVE:   Ht 5\' 9"  (1.753 m)   Wt 190 lb (86.2 kg)   BMI 28.06 kg/m    GEN: well appearing male in no acute distress  CVS: well perfused  RESP: speaking in full sentences without pause, no respiratory distress  Left Knee Exam -Inspection: anterior knee swelling, no discoloration or gross deformity  -Palpation: no joint line tenderness, +effusion  -ROM: limited active extension compared to right due to edema, full active and passive ROM  -Strength 5/5  -Special Tests: Varus Stress: Negative; Valgus Stress: Negative; Lachman: slightly positive as there is slight laxity; Posterior drawer: Negative; Anterior: Negative;  -Limb neurovascularly intact, no instability noted    ASSESSMENT/PLAN:   Left knee pain Pt is a 55 yo active male with 8 days of left knee pain. OA vs possible old ACL injury. Doubt prepatellar bursitis. Discussed treatment options. Recommend compression sleeve with activity,  Voltaren tabs BID for 7d then PRN. Obtain knee xrays. Advised to obtain standing desk to increase mobility. Discontinue Mobic. Follow up in 2 weeks.      55, DO PGY-3,  Family Medicine 06/04/2021    Patient seen and evaluated with resident.  I agree with the above plan of care.  Patient has a 1+ effusion but it is not terribly painful.  Swelling is improving  per his report.  We will try a body helix compression sleeve and a 5-day course of diclofenac.  We will also get x-rays to evaluate degree of osteoarthritis present.  He does have a history of a remote knee injury and his exam today does show some mild laxity of the ACL.  Follow-up with me in 2 weeks.  We will discuss x-ray findings at that visit.  If swelling persists then consider merits of cortisone injection.  Call with questions or concerns in the interim.

## 2021-06-18 ENCOUNTER — Ambulatory Visit (INDEPENDENT_AMBULATORY_CARE_PROVIDER_SITE_OTHER): Payer: 59 | Admitting: Sports Medicine

## 2021-06-18 DIAGNOSIS — M25562 Pain in left knee: Secondary | ICD-10-CM | POA: Diagnosis not present

## 2021-06-18 MED ORDER — METHYLPREDNISOLONE ACETATE 40 MG/ML IJ SUSP
40.0000 mg | Freq: Once | INTRAMUSCULAR | Status: AC
  Administered 2021-06-18: 17:00:00 40 mg via INTRA_ARTICULAR

## 2021-06-18 NOTE — Progress Notes (Signed)
° °  SUBJECTIVE:   CHIEF COMPLAINT / HPI:   Nathan Hughes is a 55 y.o. male here for follow up for his right hip and left knee pain.  Reports decreased swelling in his right knee since his last visit earlier this month. Knee pain worse with walking his dog uphill. He has switched his work location in in home to allow for more standing throughout his day.   Continues having right anterior hip / groin pain for the past 2 months. Hip pain is worse in the morning and with increased bending activity. Neither pain radiates.  Reports no history of injury however did play college football. Taking Voltaren twice a day with some relief.    PERTINENT  PMH / PSH: reviewed and updated as appropriate   OBJECTIVE:   BP 138/82    Ht 5\' 9"  (1.753 m)    Wt 190 lb (86.2 kg)    BMI 28.06 kg/m    GEN: well appearing male in no acute distress  CVS: well perfused  RESP: speaking in full sentences without pause, no respiratory distress  MSK:  Left knee:  -Inspection: anterior knee swelling decreased from previous, no discoloration or gross deformity  -Palpation: no joint line tenderness, +effusion  -ROM: limited active flexion compared to right due to edema -Strength 5/5  -Special Tests: Varus Stress: Negative; Valgus Stress: Negative; Anterior drawer: Negative; Posterior drawer: Negative; -Limb neurovascularly intact, no instability noted  Right hip:  - Inspection: No gross deformity, no swelling, erythema, or ecchymosis  - Palpation: no TTP over greater trochanter, non-tender tendons - ROM: normal flexion, extension, external rotation, decreased internal rotation compared to left  - Strength: 5/5  - Neurovascularly intact  - Special Tests: Positive FADIR   PROCEDURE: JOINT INJECTION After informed written consent timeout was performed, patient was seated in chair in exam room. Right knee was prepped with alcohol swab and utilizing anteromedial approach, patient's right knee was injected  intraarticularly with 3 cc Lidocaine and 40 mg depomedrol.  Patient tolerated the procedure well without immediate complications.        ASSESSMENT/PLAN:    Left Knee Pain  Reviewed knee xrays that were grossly unremarkable. Dicussed steroid injection and pt willing to see if this causing pain relief and helps with swelling. Advised knee brace with activity.  Pt received steroid injection today and tolerated well.  Continue Voltaren tabs PRN with food. Follow up in 4 weeks.   Right Hip Osteoarthritis  Reviewed xrays and pt has right hip OA.  Discussed ultrasound guided steroid injection.  Pt to call if he is interested in this. Continue NSAIDs as above.   , DO PGY-3, [facility] 06/18/2021         Patient seen and evaluated with the resident.  I agree with the above plan of care.  Patient's left knee x-rays do not show much degenerative change.  However, he continues to have a small effusion in this knee.  I recommended a cortisone injection today with a follow-up visit with me again in 4 weeks.  If symptoms persist despite today's injection then consider further diagnostic imaging.  In regards to his right hip pain, recent x-rays show osteoarthritis.  His physical exam findings confirm this as well.  I did discuss the possibility of a cortisone injection if symptoms warrant down the road.  Definitive treatment here is a total hip arthroplasty.  He understands.

## 2021-07-11 DIAGNOSIS — M1611 Unilateral primary osteoarthritis, right hip: Secondary | ICD-10-CM | POA: Diagnosis not present

## 2021-07-11 DIAGNOSIS — M25551 Pain in right hip: Secondary | ICD-10-CM | POA: Diagnosis not present

## 2021-07-26 ENCOUNTER — Ambulatory Visit: Payer: 59 | Admitting: Legal Medicine

## 2021-07-26 ENCOUNTER — Other Ambulatory Visit: Payer: Self-pay

## 2021-07-26 ENCOUNTER — Encounter: Payer: Self-pay | Admitting: Legal Medicine

## 2021-07-26 VITALS — BP 140/90 | HR 76 | Temp 98.2°F | Resp 14 | Ht 69.0 in | Wt 189.0 lb

## 2021-07-26 DIAGNOSIS — Z01812 Encounter for preprocedural laboratory examination: Secondary | ICD-10-CM | POA: Diagnosis not present

## 2021-07-26 DIAGNOSIS — Z01818 Encounter for other preprocedural examination: Secondary | ICD-10-CM

## 2021-07-26 NOTE — Progress Notes (Signed)
Subjective:  Patient ID: Nathan Hughes, male    DOB: September 25, 1965  Age: 56 y.o. MRN: [id number]  Chief Complaint  Patient presents with   Surgery clearance   Hip Pain    Right    HPI  Patient is set up for right anterior hip arthroplasty under spinal anesthesia on 08/16/2021 per Dr Gean BirchwoodFrank Rowan. Patient is here for Pre-op and blood work.  Current Outpatient Medications on File Prior to Visit  Medication Sig Dispense Refill   aspirin 81 MG EC tablet one time daily.     losartan (COZAAR) 50 MG tablet Take 1 tablet by mouth once daily 90 tablet 2   Omega-3 Fatty Acids (FISH OIL) 1000 MG CAPS one time daily.     testosterone cypionate (DEPOTESTOSTERONE CYPIONATE) 200 MG/ML injection Inject 1 mL (200 mg total) into the muscle every 14 (fourteen) days. 10 mL 3   Vitamins/Minerals TABS 1 tab one time daily.     No current facility-administered medications on file prior to visit.   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   Past Surgical History:  Procedure Laterality Date   INGUINAL HERNIA REPAIR Right 1997    Family History  Problem Relation Age of Onset   Stroke Father    Heart disease Father    Hypokalemia Sister    Kidney disease Sister    Social History   Socioeconomic History   Marital status: Married    Spouse name: Not on file   Number of children: 2   Years of education: Not on file   Highest education level: Not on file  Occupational History   Occupation: Event organiserecurity Officer    Employer: [employer]   Occupation: Emergency planning/management officerolice officer  Tobacco Use   Smoking status: Never   Smokeless tobacco: Never  Substance and Sexual Activity   Alcohol use: Yes    Alcohol/week: 4.0 standard drinks    Types: 4 Cans of beer per week    Comment: ocassionally   Drug use: Never   Sexual activity: Yes    Partners: Female  Other Topics Concern   Not on file  Social History Narrative   Not on file    Social Determinants of Health   Financial Resource Strain: Not on file  Food Insecurity: Not on file  Transportation Needs: Not on file  Physical Activity: Not on file  Stress: Not on file  Social Connections: Not on file    Review of Systems  Constitutional:  Negative for chills, fatigue, fever and unexpected weight change.  HENT:  Negative for congestion, ear pain, sinus pain and sore throat.   Respiratory:  Negative for cough and shortness of breath.   Cardiovascular:  Negative for chest pain and palpitations.  Gastrointestinal:  Negative for abdominal pain, blood in stool, constipation, diarrhea, nausea and vomiting.  Endocrine: Negative for polydipsia.  Genitourinary:  Negative for dysuria.  Musculoskeletal:  Positive for arthralgias (Right hip pain) and joint swelling. Negative for back pain.       Left knee pain  Skin:  Negative for rash.  Neurological:  Negative for headaches.    Objective:  BP 140/90    Pulse 76    Temp 98.2 F (36.8 C)    Resp 14    Ht 5\' 9"  (1.753 m)    Wt 189 lb (85.7 kg)    SpO2 99%    BMI 27.91 kg/m   BP/Weight 07/26/2021 06/18/2021  06/04/2021  Systolic BP 140 138 -  Diastolic BP 90 82 -  Wt. (Lbs) 189 190 190  BMI 27.91 28.06 28.06    Physical Exam Constitutional:      Appearance: Normal appearance. He is obese.  HENT:     Head: Normocephalic.     Right Ear: Tympanic membrane, ear canal and external ear normal.     Left Ear: Tympanic membrane, ear canal and external ear normal.     Nose: Nose normal.     Mouth/Throat:     Mouth: Mucous membranes are moist.     Pharynx: Oropharynx is clear. No posterior oropharyngeal erythema.  Eyes:     Extraocular Movements: Extraocular movements intact.     Conjunctiva/sclera: Conjunctivae normal.     Pupils: Pupils are equal, round, and reactive to light.  Neck:     Vascular: No carotid bruit.  Cardiovascular:     Rate and Rhythm: Normal rate and regular rhythm.     Pulses: Normal pulses.      Heart sounds: Normal heart sounds. No murmur heard. Pulmonary:     Effort: Pulmonary effort is normal.     Breath sounds: Normal breath sounds.  Abdominal:     General: Bowel sounds are normal.     Palpations: Abdomen is soft. There is no mass.  Musculoskeletal:        General: Tenderness present. Normal range of motion.     Cervical back: Normal range of motion. No tenderness.     Right lower leg: No edema.     Left lower leg: No edema.     Comments: Rotation is about 20-30 degrees with pain. Extension 10 degrees, flexion 80 degrees, adduction 10 degrees.                 Skin:    Capillary Refill: Capillary refill takes 2 to 3 seconds.  Neurological:     Gait: Gait normal.     Deep Tendon Reflexes: Reflexes normal.  Psychiatric:        Mood and Affect: Mood normal.        Behavior: Behavior normal.        Thought Content: Thought content normal.    Diabetic Foot Exam - Simple   No data filed      Lab Results  Component Value Date   WBC 5.5 07/26/2021   HGB 14.5 07/26/2021   HCT 41.5 07/26/2021   PLT 247 07/26/2021   GLUCOSE 95 07/26/2021   CHOL 166 01/16/2021   TRIG 58 01/16/2021   HDL 73 01/16/2021   LDLCALC 81 01/16/2021   ALT 25 07/26/2021   AST 19 07/26/2021   NA 139 07/26/2021   K 5.0 07/26/2021   CL 100 07/26/2021   CREATININE 0.85 07/26/2021   BUN 18 07/26/2021   CO2 26 07/26/2021   INR 1.0 07/26/2021   HGBA1C 5.0 07/26/2021      Assessment & Plan:   Problem List Items Addressed This Visit   None Visit Diagnoses     Preoperative clearance    -  Primary Normal PE. Cleared for surgery   Pre-operative laboratory examination       Relevant Orders   Comprehensive metabolic panel (Completed)   CBC with Differential/Platelet (Completed)   Hemoglobin A1c (Completed)   Protime-INR (Completed)     .    Orders Placed This Encounter  Procedures   Comprehensive metabolic panel   CBC with Differential/Platelet   Hemoglobin A1c   Protime-INR  Follow-up: No follow-ups on file.  An After Visit Summary was printed and given to the patient.  Brent Bulla, MD [facility] [phone]

## 2021-07-27 LAB — CBC WITH DIFFERENTIAL/PLATELET
Basophils Absolute: 0.1 10*3/uL (ref 0.0–0.2)
Basos: 1 %
EOS (ABSOLUTE): 0.1 10*3/uL (ref 0.0–0.4)
Eos: 2 %
Hematocrit: 41.5 % (ref 37.5–51.0)
Hemoglobin: 14.5 g/dL (ref 13.0–17.7)
Immature Grans (Abs): 0 10*3/uL (ref 0.0–0.1)
Immature Granulocytes: 0 %
Lymphocytes Absolute: 1.1 10*3/uL (ref 0.7–3.1)
Lymphs: 20 %
MCH: 33 pg (ref 26.6–33.0)
MCHC: 34.9 g/dL (ref 31.5–35.7)
MCV: 95 fL (ref 79–97)
Monocytes Absolute: 0.7 10*3/uL (ref 0.1–0.9)
Monocytes: 13 %
Neutrophils Absolute: 3.5 10*3/uL (ref 1.4–7.0)
Neutrophils: 64 %
Platelets: 247 10*3/uL (ref 150–450)
RBC: 4.39 x10E6/uL (ref 4.14–5.80)
RDW: 11.3 % — ABNORMAL LOW (ref 11.6–15.4)
WBC: 5.5 10*3/uL (ref 3.4–10.8)

## 2021-07-27 LAB — COMPREHENSIVE METABOLIC PANEL
ALT: 25 IU/L (ref 0–44)
AST: 19 IU/L (ref 0–40)
Albumin/Globulin Ratio: 1.5 (ref 1.2–2.2)
Albumin: 4.7 g/dL (ref 3.8–4.9)
Alkaline Phosphatase: 54 IU/L (ref 44–121)
BUN/Creatinine Ratio: 21 — ABNORMAL HIGH (ref 9–20)
BUN: 18 mg/dL (ref 6–24)
Bilirubin Total: 1.3 mg/dL — ABNORMAL HIGH (ref 0.0–1.2)
CO2: 26 mmol/L (ref 20–29)
Calcium: 9.9 mg/dL (ref 8.7–10.2)
Chloride: 100 mmol/L (ref 96–106)
Creatinine, Ser: 0.85 mg/dL (ref 0.76–1.27)
Globulin, Total: 3.2 g/dL (ref 1.5–4.5)
Glucose: 95 mg/dL (ref 70–99)
Potassium: 5 mmol/L (ref 3.5–5.2)
Sodium: 139 mmol/L (ref 134–144)
Total Protein: 7.9 g/dL (ref 6.0–8.5)
eGFR: 103 mL/min/{1.73_m2} (ref >59)

## 2021-07-27 LAB — PROTIME-INR
INR: 1 (ref 0.9–1.2)
Prothrombin Time: 10.2 s (ref 9.1–12.0)

## 2021-07-27 LAB — HEMOGLOBIN A1C
Est. average glucose Bld gHb Est-mCnc: 97 mg/dL
Hgb A1c MFr Bld: 5 % (ref 4.8–5.6)

## 2021-07-28 ENCOUNTER — Encounter: Payer: Self-pay | Admitting: Legal Medicine

## 2021-07-28 NOTE — Progress Notes (Signed)
Kidney and liver tests normal, CBC normal, A1c 5.0 normal, protime 1.o lp

## 2021-07-29 ENCOUNTER — Ambulatory Visit: Payer: 59 | Admitting: Legal Medicine

## 2021-07-30 ENCOUNTER — Other Ambulatory Visit: Payer: Self-pay

## 2021-07-30 DIAGNOSIS — M25551 Pain in right hip: Secondary | ICD-10-CM

## 2021-07-30 NOTE — Progress Notes (Signed)
Pt called requesting [facility] total [facility] replacement.  Referral placed to Dr. Magnus Ivan. The pt will be contacted to schedule an appt.

## 2021-08-05 ENCOUNTER — Encounter: Payer: Self-pay | Admitting: Physician Assistant

## 2021-08-05 ENCOUNTER — Other Ambulatory Visit: Payer: Self-pay

## 2021-08-05 ENCOUNTER — Ambulatory Visit (INDEPENDENT_AMBULATORY_CARE_PROVIDER_SITE_OTHER): Payer: 59 | Admitting: Physician Assistant

## 2021-08-05 DIAGNOSIS — M1611 Unilateral primary osteoarthritis, right hip: Secondary | ICD-10-CM | POA: Diagnosis not present

## 2021-08-05 NOTE — Progress Notes (Signed)
Office Visit Note   Patient: Nathan Hughes           Date of Birth: 1966-02-11           MRN: [id number] Visit Date: 08/05/2021              Requested by: Thurman Coyer, DO [address],  [place] [address] PCP: Lillard Anes, MD   Assessment & Plan: Visit Diagnoses:  1. Primary osteoarthritis of right hip     Plan: Given patient's end-stage arthritis involving the right hip and the fact that it is affecting his quality of life he would like to proceed with right total hip arthroplasty in the near future.  We will work on scheduling for right total hip arthroplasty.  Questions were encouraged and answered by Dr. Ninfa Linden self.  Risk benefits surgery discussed with patient.  Risk include infection, wound healing problems, nerve vessel injury, leg length discrepancy, DVT/PE and blood loss.  Postoperative protocol discussed with patient.  We will work on scheduling for right total hip arthroplasty in the near future.  Handout on total hip arthroplasty was given.  Follow-Up Instructions: Return for post op.   Orders:  No orders of the defined types were placed in this encounter.  No orders of the defined types were placed in this encounter.     Procedures: No procedures performed   Clinical Data: No additional findings.   Subjective: Chief Complaint  Patient presents with   Right Hip - Pain    HPI Nathan Hughes is a 56 year old male comes in today with right hip pain.  He states his pain began in his right hip in October.  He was seen at another orthopedic office here in [place] and was set up for surgery.  However due to insurance he has decided to seek a second opinion.  Having right groin pain.  He has difficulty ambulating at times due to the pain in the hip.  He has tried diclofenac and ibuprofen with minimal relief.  No known injury to the hip.  No numbness tingling down the leg.  Radiographs of his right hip are reviewed and show moderately  severe narrowing of the right hip and mild narrowing of the left hip.  No acute fractures bony abnormalities.  He states that the pain in his hip is affecting his quality of life.  Review of Systems See HPI otherwise negative  Objective: Vital Signs: There were no vitals taken for this visit.  Physical Exam General well-developed well-nourished male no acute distress mood and affect appropriate Psych: Alert and oriented x3 Ortho Exam Bilateral hips good range of motion external rotation.  Internal rotation is slightly limited on the left and greatly limited on the right with pain.  Calves are supple nontender bilaterally dorsiflexion plantarflexion bilateral ankles intact.  Ambulates without any assistive device with an antalgic gait on the right. Specialty Comments:  No specialty comments available.  Imaging: No results found.   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    Family History  Problem Relation Age of Onset   Stroke Father    Heart disease Father    Hypokalemia Sister    Kidney  disease Sister     Past Surgical History:  Procedure Laterality Date   INGUINAL HERNIA REPAIR Right 1997   Social History   Occupational History   Occupation: Building services engineer: [employer]   Occupation: Engineer, structural  Tobacco Use   Smoking status: Never   Smokeless tobacco: Never  Substance and Sexual Activity   Alcohol use: Yes    Alcohol/week: 4.0 standard drinks    Types: 4 Cans of beer per week    Comment: ocassionally   Drug use: Never   Sexual activity: Yes    Partners: Female

## 2021-08-22 ENCOUNTER — Other Ambulatory Visit: Payer: Self-pay

## 2021-09-19 ENCOUNTER — Other Ambulatory Visit: Payer: Self-pay | Admitting: Physician Assistant

## 2021-09-19 DIAGNOSIS — M1611 Unilateral primary osteoarthritis, right hip: Secondary | ICD-10-CM

## 2021-09-25 ENCOUNTER — Other Ambulatory Visit (HOSPITAL_COMMUNITY): Payer: Self-pay

## 2021-09-25 ENCOUNTER — Other Ambulatory Visit: Payer: Self-pay | Admitting: Legal Medicine

## 2021-09-25 DIAGNOSIS — E291 Testicular hypofunction: Secondary | ICD-10-CM

## 2021-09-25 MED ORDER — TESTOSTERONE CYPIONATE 200 MG/ML IM SOLN
200.0000 mg | INTRAMUSCULAR | 0 refills | Status: DC
  Filled 2021-09-25: qty 6, 84d supply, fill #0
  Filled 2021-12-18: qty 6, 84d supply, fill #1

## 2021-09-30 NOTE — Progress Notes (Signed)
Surgical Instructions ? ? ? Your procedure is scheduled on Tuesday, April 11th, 2023. ? ? Report to [facility] Main Entrance "A" at 10"00 A.M., then check in with the Admitting office. ? Call this number if you have problems the morning of surgery: ? [phone] ? ? If you have any questions prior to your surgery date call [phone]: Open Monday-Friday 8am-4pm ? ? ? Remember: ? Do not eat after midnight the night before your surgery ? ?You may drink clear liquids until 09:00 the morning of your surgery.   ?Clear liquids allowed are: Water, Non-Citrus Juices (without pulp), Carbonated Beverages, Clear Tea, Black Coffee ONLY (NO MILK, CREAM OR POWDERED CREAMER of any kind), and Gatorade ? ?Patient Instructions ? ?The night before surgery:  ?No food after midnight. ONLY clear liquids after midnight ? ?The day of surgery (if you do NOT have diabetes):  ?Drink ONE (1) Pre-Surgery Clear Ensure by 09:00 the morning of surgery. Drink in one sitting. Do not sip.  ?This drink was given to you during your hospital  ?pre-op appointment visit. ? ?Nothing else to drink after completing the  ?Pre-Surgery Clear Ensure. ? ?       If you have questions, please contact your surgeon?s office.  ?  ? Take these medicines the morning of surgery with A SIP OF WATER: NONE ? ?Follow your surgeon's instructions on when to stop Aspirin.  If no instructions were given by your surgeon then you will need to call the office to get those instructions.    ? ?As of today, STOP taking any Aspirin (unless otherwise instructed by your surgeon) Aleve, Naproxen, Ibuprofen, Motrin, Advil, Goody's, BC's, all herbal medications, fish oil, and all vitamins. ? ? ?The day of surgery: ?         ?Do not wear jewelry  ?Do not wear lotions, powders, colognes, or deodorant. ?Men may shave face and neck. ?Do not bring valuables to the hospital. ? ? ?[facility] is not responsible for any belongings or valuables. .  ? ?Do NOT Smoke (Tobacco/Vaping)  24 hours prior  to your procedure ? ?If you use a CPAP at night, you may bring your mask for your overnight stay. ?  ?Contacts, glasses, hearing aids, dentures or partials may not be worn into surgery, please bring cases for these belongings ?  ?For patients admitted to the hospital, discharge time will be determined by your treatment team. ?  ?Patients discharged the day of surgery will not be allowed to drive home, and someone needs to stay with them for 24 hours. ? ? ?SURGICAL WAITING ROOM VISITATION ?Patients having surgery or a procedure in a hospital may have two support people. ?Children under the age of 57 must have an adult with them who is not the patient. ?They may stay in the waiting area during the procedure and may switch out with other visitors. If the patient needs to stay at the hospital during part of their recovery, the visitor guidelines for inpatient rooms apply. ? ?Please refer to the [facility] website for the visitor guidelines for Inpatients (after your surgery is over and you are in a regular room).  ? ? ?Special instructions:   ? ?Oral Hygiene is also important to reduce your risk of infection.  Remember - BRUSH YOUR TEETH THE MORNING OF SURGERY WITH YOUR REGULAR TOOTHPASTE ? ? ?[facility]- Preparing For Surgery ? ?Before surgery, you can play an important role. Because skin is not sterile, your skin needs to be  as free of germs as possible. You can reduce the number of germs on your skin by washing with CHG (chlorahexidine gluconate) Soap before surgery.  CHG is an antiseptic cleaner which kills germs and bonds with the skin to continue killing germs even after washing.   ? ? ?Please do not use if you have an allergy to CHG or antibacterial soaps. If your skin becomes reddened/irritated stop using the CHG.  ?Do not shave (including legs and underarms) for at least 48 hours prior to first CHG shower. It is OK to shave your face. ? ?Please follow these instructions carefully. ?  ? ? Shower the NIGHT  BEFORE SURGERY and the MORNING OF SURGERY with CHG Soap.  ? If you chose to wash your hair, wash your hair first as usual with your normal shampoo. After you shampoo, rinse your hair and body thoroughly to remove the shampoo.  Then [employer] and genitals (private parts) with your normal soap and rinse thoroughly to remove soap. ? ?After that Use CHG Soap as you would any other liquid soap. You can apply CHG directly to the skin and wash gently with a scrungie or a clean washcloth.  ? ?Apply the CHG Soap to your body ONLY FROM THE NECK DOWN.  Do not use on open wounds or open sores. Avoid contact with your eyes, ears, mouth and genitals (private parts). Wash Face and genitals (private parts)  with your normal soap.  ? ?Wash thoroughly, paying special attention to the area where your surgery will be performed. ? ?Thoroughly rinse your body with warm water from the neck down. ? ?DO NOT shower/wash with your normal soap after using and rinsing off the CHG Soap. ? ?Pat yourself dry with a CLEAN TOWEL. ? ?Wear CLEAN PAJAMAS to bed the night before surgery ? ?Place CLEAN SHEETS on your bed the night before your surgery ? ?DO NOT SLEEP WITH PETS. ? ? ?Day of Surgery: ? ?Take a shower with CHG soap. ?Wear Clean/Comfortable clothing the morning of surgery ?Do not apply any deodorants/lotions.   ?Remember to brush your teeth WITH YOUR REGULAR TOOTHPASTE. ? ? ? ?If you received a COVID test during your pre-op visit  it is requested that you wear a mask when out in public, stay away from anyone that may not be feeling well and notify your surgeon if you develop symptoms. If you have been in contact with anyone that has tested positive in the last 10 days please notify you surgeon. ? ?  ?Please read over the following fact sheets that you were given.   ?

## 2021-10-01 ENCOUNTER — Encounter (HOSPITAL_COMMUNITY)
Admission: RE | Admit: 2021-10-01 | Discharge: 2021-10-01 | Disposition: A | Discharge status: Home / Self Care | Payer: 59 | Source: Ambulatory Visit | Attending: Orthopaedic Surgery | Admitting: Orthopaedic Surgery

## 2021-10-01 ENCOUNTER — Encounter (HOSPITAL_COMMUNITY): Payer: Self-pay

## 2021-10-01 ENCOUNTER — Other Ambulatory Visit: Payer: Self-pay

## 2021-10-01 VITALS — BP 142/89 | HR 67 | Temp 98.4°F | Resp 17 | Ht 69.0 in | Wt 188.0 lb

## 2021-10-01 DIAGNOSIS — M1611 Unilateral primary osteoarthritis, right hip: Secondary | ICD-10-CM | POA: Diagnosis not present

## 2021-10-01 DIAGNOSIS — I119 Hypertensive heart disease without heart failure: Secondary | ICD-10-CM | POA: Diagnosis not present

## 2021-10-01 DIAGNOSIS — Z01818 Encounter for other preprocedural examination: Secondary | ICD-10-CM | POA: Insufficient documentation

## 2021-10-01 HISTORY — DX: Unspecified osteoarthritis, unspecified site: M19.90

## 2021-10-01 LAB — CBC
HCT: 43.1 % (ref 39.0–52.0)
Hemoglobin: 14.2 g/dL (ref 13.0–17.0)
MCH: 32.5 pg (ref 26.0–34.0)
MCHC: 32.9 g/dL (ref 30.0–36.0)
MCV: 98.6 fL (ref 80.0–100.0)
Platelets: 270 10*3/uL (ref 150–400)
RBC: 4.37 MIL/uL (ref 4.22–5.81)
RDW: 12.7 % (ref 11.5–15.5)
WBC: 4.8 10*3/uL (ref 4.0–10.5)
nRBC: 0 % (ref 0.0–0.2)

## 2021-10-01 LAB — BASIC METABOLIC PANEL
Anion gap: 6 (ref 5–15)
BUN: 15 mg/dL (ref 6–20)
CO2: 30 mmol/L (ref 22–32)
Calcium: 9.8 mg/dL (ref 8.9–10.3)
Chloride: 104 mmol/L (ref 98–111)
Creatinine, Ser: 0.9 mg/dL (ref 0.61–1.24)
GFR, Estimated: >60 mL/min (ref >60)
Glucose, Bld: 108 mg/dL — ABNORMAL HIGH (ref 70–99)
Potassium: 4.4 mmol/L (ref 3.5–5.1)
Sodium: 140 mmol/L (ref 135–145)

## 2021-10-01 LAB — TYPE AND SCREEN
ABO/RH(D): O POS
Antibody Screen: NEGATIVE

## 2021-10-01 LAB — SURGICAL PCR SCREEN
MRSA, PCR: NEGATIVE
Staphylococcus aureus: NEGATIVE

## 2021-10-01 NOTE — Progress Notes (Addendum)
PCP - Abigail Miyamoto, MD ?Cardiologist - denies ? ?PPM/ICD - denies ?Device Orders - n/a ?Rep Notified - n/a ? ?Chest x-ray - 08/29/2020 ?EKG - 10/01/2021 ?Stress Test - 03.29/2022 ?ECHO - 10/11/2020 ?Cardiac Cath - denies ? ?Sleep Study - denies ?CPAP - n/a ? ?Fasting Blood Sugar - n/a ? ?Blood Thinner Instructions: n/a ? ?Aspirin Instructions - last dose on 10/01/2021 per patient ? ?Patient was instructed: As of today, STOP taking any Aspirin (unless otherwise instructed by your surgeon) Aleve, Naproxen, Ibuprofen, Motrin, Advil, Goody's, BC's, all herbal medications, fish oil, and all vitamins. ? ?ERAS Protcol - yes ?PRE-SURGERY Ensure - yes, until 09:00 o'clock ? ?COVID TEST- n/a ? ? ?Anesthesia review: yes - review EKG. No cardiac history; patient had Echo and Stress Test last year because he was a potential kidney donor for his sister.  ? ?Patient denies shortness of breath, fever, cough and chest pain at PAT appointment ? ? ?All instructions explained to the patient, with a verbal understanding of the material. Patient agrees to go over the instructions while at home for a better understanding. Patient also instructed to self quarantine after being tested for COVID-19. The opportunity to ask questions was provided. ?  ?

## 2021-10-02 NOTE — Progress Notes (Signed)
Anesthesia Chart Review: ? Case: [id number] Date/Time: 10/08/21 1145  ? Procedure: Right TOTAL HIP ARTHROPLASTY ANTERIOR APPROACH (Right: Hip)  ? Anesthesia type: Choice  ? Pre-op diagnosis: Endstage Right Hip Osteoarthritis  ? Location: [facility] ROOM 04 / [facility]  ? Surgeons: Mcarthur Rossetti, MD  ? ?  ? ? ?DISCUSSION: Patient is a 56 year old male scheduled for the above procedure. ? ?History includes never smoker, HTN, arthritis. ? ?He was medically cleared for right THA by Dr. Henrene Pastor at 07/26/21 visit.  He had unremarkable stress test and echocardiogram in Spring 2022.  ? ?Anesthesia team to evaluate on the day of surgery. ? ? ?VS: BP (!) 142/89   Pulse 67   Temp 36.9 ?C (Oral)   Resp 17   Ht 5\' 9"  (1.753 m)   Wt 85.3 kg   SpO2 100%   BMI 27.76 kg/m?  ? ? ?PROVIDERS: ?Lillard Anes, MD is PCP  ?- He is not routinely followed by cardiology, but was evaluated by Eleonore Chiquito, MD in 2022 as part of a kidney donor work-up.  He was being considered for kidney donation for his sister in [place], [place].  He had a normal stress test in March 2022.  Echo in April 2022 was reassuring with normal LVEF, no RWMA, mild LVH, no significant valvular abnormalities. As of May 2022, he was declined as a donor because his BP was not at goal. Dr. Audie Box advised follow-up with PCP. ? ? ?LABS: Labs reviewed: Acceptable for surgery. ?(all labs ordered are listed, but only abnormal results are displayed) ? ?Labs Reviewed  ?BASIC METABOLIC PANEL - Abnormal; Notable for the following components:  ?    Result Value  ? Glucose, Bld 108 (*)   ? All other components within normal limits  ?SURGICAL PCR SCREEN  ?CBC  ?TYPE AND SCREEN  ? ? ?IMAGES: ?Xray bilateral hips 05/10/21: ?IMPRESSION: ?Moderate right and mild left more acetabular joint space narrowing ?with associated subchondral sclerosis consistent with ?osteoarthritis. ? ? ?EKG: 10/01/21: ?Sinus rhythm with 1st degree A-V block ?Borderline ECG ?No previous ECGs  available ?Confirmed by Rudean Haskell [phone]) on 10/01/2021 2:11:24 PM ?- EKG is similar to 09/19/20 tracing from [facility]. ? ? ?CV: ?Echo 10/11/20: ?IMPRESSIONS  ? 1. Left ventricular ejection fraction, by estimation, is 60 to 65%. The  ?left ventricle has normal function. The left ventricle has no regional  ?wall motion abnormalities. There is mild concentric left ventricular  ?hypertrophy. Left ventricular diastolic  ?parameters were normal.  ? 2. Right ventricular systolic function is normal. The right ventricular  ?size is normal. There is normal pulmonary artery systolic pressure.  ? 3. The mitral valve is normal in structure. Trivial mitral valve  ?regurgitation. No evidence of mitral stenosis.  ? 4. The aortic valve is normal in structure. Aortic valve regurgitation is  ?not visualized. No aortic stenosis is present.  ? 5. 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  ? 6. The inferior vena cava is normal in size with greater than 50%  ?respiratory variability, suggesting right atrial pressure of 3 mmHg.  ? ? ?Nuclear stress test 09/23/20: ?The left ventricular ejection fraction is hyperdynamic (>65%). ?Nuclear stress EF: 67%. ?There was no ST segment deviation noted during stress. ?This is a low risk study. ?The study is normal. ?  ?Low risk stress nuclear study with normal perfusion and normal left ventricular regional and global systolic function. ? ? ?Past Medical History:  ?Diagnosis Date  ? Arthritis   ?  Benign essential hypertension 10/12/2019  ? 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 ? ?Past Surgical History:  ?Procedure Laterality Date  ? INGUINAL HERNIA REPAIR Right 1997  ? VASECTOMY    ? ? ?MEDICATIONS: ? aspirin 81 MG EC tablet  ? Glucosamine-Chondroit-Vit C-Mn (GLUCOSAMINE-CHONDROITIN) TABS  ? ibuprofen (ADVIL) 200 MG tablet  ? losartan (COZAAR) 50 MG tablet  ? Multiple Vitamins-Minerals (MENS MULTIVITAMIN) CHEW  ? Omega-3 Fatty Acids (FISH OIL) 1000 MG CAPS  ?  testosterone cypionate (DEPOTESTOSTERONE CYPIONATE) 200 MG/ML injection  ? ?No current facility-administered medications for this encounter.  ?Last aspirin 10/01/21 per patient. ? ? ?Myra Gianotti, PA-C ?Surgical Short Stay/Anesthesiology ?[facility] Phone [phone] ?[facility] Phone [phone] ?10/02/2021 12:54 PM ? ? ? ? ? ? ?

## 2021-10-02 NOTE — Anesthesia Preprocedure Evaluation (Addendum)
Anesthesia Evaluation  ?Patient identified by MRN, date of birth, ID band ?Patient awake ? ? ? ?Reviewed: ?Allergy & Precautions, NPO status , Patient's Chart, lab work & pertinent test results ? ?History of Anesthesia Complications ?Negative for: history of anesthetic complications ? ?Airway ?Mallampati: II ? ?TM Distance: >3 FB ?Neck ROM: Full ? ? ? Dental ? ?(+) Dental Advisory Given ?  ?Pulmonary ?neg pulmonary ROS,  ?  ?breath sounds clear to auscultation ? ? ? ? ? ? Cardiovascular ?hypertension, Pt. on medications ?(-) angina ?Rhythm:Regular Rate:Normal ? ?Echo 10/11/20: ?1. Left ventricular ejection fraction, by estimation, is 60 to 65%. The left ventricle has normal function. The left ventricle has no regional wall motion abnormalities. There is mild concentric left ventricular hypertrophy. Left ventricular diastolic parameters were normal.  ?2. Right ventricular systolic function is normal. The right ventricular size is normal. There is normal pulmonary artery systolic pressure.  ?3. The mitral valve is normal in structure. Trivial mitral valve regurgitation. No evidence of mitral stenosis.  ?4. The aortic valve is normal in structure. Aortic valve regurgitation is not visualized. No aortic stenosis is present.  ?5. 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  ?6. The inferior vena cava is normal in size with greater than 50% respiratory variability, suggesting right atrial pressure of 3 mmHg.  ? ?Nuclear stress test 09/23/20: ?? The left ventricular ejection fraction is hyperdynamic (>65%). ?? Nuclear stress EF: 67%. ?? There was no ST segment deviation noted during stress. ?? This is a low risk study. ?? The study is normal. ? ?  ?Neuro/Psych ?negative neurological ROS ?   ? GI/Hepatic ?negative GI ROS, Neg liver ROS,   ?Endo/Other  ?negative endocrine ROS ? Renal/GU ?  ? ?  ?Musculoskeletal ? ?(+) Arthritis , Osteoarthritis,   ? Abdominal ?  ?Peds ?  Hematology ?negative hematology ROS ?(+)   ?Anesthesia Other Findings ? ? Reproductive/Obstetrics ? ?  ? ? ? ? ? ? ? ? ? ? ? ? ? ?  ?  ? ? ? ? ? ? ?Anesthesia Physical ?Anesthesia Plan ? ?ASA: 2 ? ?Anesthesia Plan: Spinal  ? ?Post-op Pain Management: Tylenol PO (pre-op)*  ? ?Induction:  ? ?PONV Risk Score and Plan: 1 and Ondansetron and Treatment may vary due to age or medical condition ? ?Airway Management Planned: Natural Airway and Simple Face Mask ? ?Additional Equipment: None ? ?Intra-op Plan:  ? ?Post-operative Plan:  ? ?Informed Consent: I have reviewed the patients History and Physical, chart, labs and discussed the procedure including the risks, benefits and alternatives for the proposed anesthesia with the patient or authorized representative who has indicated his/her understanding and acceptance.  ? ? ? ?Dental advisory given ? ?Plan Discussed with: CRNA and Surgeon ? ?Anesthesia Plan Comments: (PAT note written 10/02/2021 by Shonna Chock, PA-C. ?)  ? ? ? ? ?Anesthesia Quick Evaluation ? ?

## 2021-10-08 ENCOUNTER — Other Ambulatory Visit: Payer: Self-pay

## 2021-10-08 ENCOUNTER — Observation Stay (HOSPITAL_COMMUNITY): Payer: 59

## 2021-10-08 ENCOUNTER — Ambulatory Visit (HOSPITAL_COMMUNITY): Payer: 59 | Admitting: Vascular Surgery

## 2021-10-08 ENCOUNTER — Ambulatory Visit (HOSPITAL_BASED_OUTPATIENT_CLINIC_OR_DEPARTMENT_OTHER): Payer: 59 | Admitting: Certified Registered Nurse Anesthetist

## 2021-10-08 ENCOUNTER — Encounter (HOSPITAL_COMMUNITY): Payer: Self-pay | Admitting: Orthopaedic Surgery

## 2021-10-08 ENCOUNTER — Ambulatory Visit (HOSPITAL_COMMUNITY): Payer: 59

## 2021-10-08 ENCOUNTER — Encounter (HOSPITAL_COMMUNITY)
Admission: RE | Disposition: A | Discharge status: Home / Self Care | Payer: Self-pay | Source: Ambulatory Visit | Attending: Orthopaedic Surgery

## 2021-10-08 ENCOUNTER — Observation Stay (HOSPITAL_COMMUNITY)
Admission: RE | Admit: 2021-10-08 | Discharge: 2021-10-09 | Disposition: A | Discharge status: Home / Self Care | Payer: 59 | Source: Ambulatory Visit | Attending: Orthopaedic Surgery | Admitting: Orthopaedic Surgery

## 2021-10-08 DIAGNOSIS — I10 Essential (primary) hypertension: Secondary | ICD-10-CM | POA: Insufficient documentation

## 2021-10-08 DIAGNOSIS — Z96641 Presence of right artificial hip joint: Secondary | ICD-10-CM

## 2021-10-08 DIAGNOSIS — M1611 Unilateral primary osteoarthritis, right hip: Secondary | ICD-10-CM | POA: Diagnosis not present

## 2021-10-08 DIAGNOSIS — Z471 Aftercare following joint replacement surgery: Secondary | ICD-10-CM | POA: Diagnosis not present

## 2021-10-08 HISTORY — PX: JOINT REPLACEMENT: SHX530

## 2021-10-08 HISTORY — PX: TOTAL HIP ARTHROPLASTY: SHX124

## 2021-10-08 HISTORY — DX: Presence of right artificial hip joint: Z96.641

## 2021-10-08 LAB — ABO/RH: ABO/RH(D): O POS

## 2021-10-08 SURGERY — ARTHROPLASTY, HIP, TOTAL, ANTERIOR APPROACH
Anesthesia: Spinal | Site: Hip | Laterality: Right

## 2021-10-08 MED ORDER — PHENYLEPHRINE HCL-NACL 20-0.9 MG/250ML-% IV SOLN
INTRAVENOUS | Status: DC | PRN
  Administered 2021-10-08: 30 ug/min via INTRAVENOUS

## 2021-10-08 MED ORDER — MIDAZOLAM HCL 2 MG/2ML IJ SOLN
INTRAMUSCULAR | Status: DC | PRN
Start: 2021-10-08 — End: 2021-10-08
  Administered 2021-10-08: 2 mg via INTRAVENOUS

## 2021-10-08 MED ORDER — DEXAMETHASONE SODIUM PHOSPHATE 10 MG/ML IJ SOLN
INTRAMUSCULAR | Status: DC | PRN
Start: 2021-10-08 — End: 2021-10-08
  Administered 2021-10-08: 10 mg via INTRAVENOUS

## 2021-10-08 MED ORDER — ACETAMINOPHEN 325 MG PO TABS
325.0000 mg | ORAL_TABLET | Freq: Four times a day (QID) | ORAL | Status: DC | PRN
  Administered 2021-10-08 – 2021-10-09 (×2): 650 mg via ORAL
  Filled 2021-10-08 (×2): qty 2

## 2021-10-08 MED ORDER — ORAL CARE MOUTH RINSE: 15.0000 mL | Freq: Once | OROMUCOSAL | Status: AC

## 2021-10-08 MED ORDER — MEPERIDINE HCL 25 MG/ML IJ SOLN: 6.2500 mg | INTRAMUSCULAR | Status: DC | PRN

## 2021-10-08 MED ORDER — POVIDONE-IODINE 10 % EX SWAB
2.0000 "application " | Freq: Once | CUTANEOUS | Status: AC
  Administered 2021-10-08: 2 via TOPICAL

## 2021-10-08 MED ORDER — LACTATED RINGERS IV SOLN: INTRAVENOUS | Status: DC

## 2021-10-08 MED ORDER — MIDAZOLAM HCL 2 MG/2ML IJ SOLN
INTRAMUSCULAR | Status: AC
  Filled 2021-10-08: qty 2

## 2021-10-08 MED ORDER — HYDROMORPHONE HCL 1 MG/ML IJ SOLN: 0.2500 mg | INTRAMUSCULAR | Status: DC | PRN

## 2021-10-08 MED ORDER — PROPOFOL 500 MG/50ML IV EMUL
INTRAVENOUS | Status: DC | PRN
  Administered 2021-10-08: 125 ug/kg/min via INTRAVENOUS

## 2021-10-08 MED ORDER — 0.9 % SODIUM CHLORIDE (POUR BTL) OPTIME
TOPICAL | Status: DC | PRN
  Administered 2021-10-08: 1000 mL

## 2021-10-08 MED ORDER — METHOCARBAMOL 500 MG PO TABS
500.0000 mg | ORAL_TABLET | Freq: Four times a day (QID) | ORAL | Status: DC | PRN
  Administered 2021-10-08 – 2021-10-09 (×3): 500 mg via ORAL
  Filled 2021-10-08 (×3): qty 1

## 2021-10-08 MED ORDER — POLYETHYLENE GLYCOL 3350 17 G PO PACK: 17.0000 g | PACK | Freq: Every day | ORAL | Status: DC | PRN

## 2021-10-08 MED ORDER — ASPIRIN 81 MG PO CHEW
81.0000 mg | CHEWABLE_TABLET | Freq: Two times a day (BID) | ORAL | Status: DC
  Administered 2021-10-08 – 2021-10-09 (×2): 81 mg via ORAL
  Filled 2021-10-08 (×2): qty 1

## 2021-10-08 MED ORDER — ONDANSETRON HCL 4 MG/2ML IJ SOLN
INTRAMUSCULAR | Status: DC | PRN
  Administered 2021-10-08: 4 mg via INTRAVENOUS

## 2021-10-08 MED ORDER — TRANEXAMIC ACID-NACL 1000-0.7 MG/100ML-% IV SOLN
1000.0000 mg | INTRAVENOUS | Status: AC
  Administered 2021-10-08: 1000 mg via INTRAVENOUS

## 2021-10-08 MED ORDER — HYDROMORPHONE HCL 1 MG/ML IJ SOLN
0.5000 mg | INTRAMUSCULAR | Status: DC | PRN
  Administered 2021-10-09: 1 mg via INTRAVENOUS
  Filled 2021-10-08: qty 1

## 2021-10-08 MED ORDER — SODIUM CHLORIDE 0.9 % IR SOLN
Status: DC | PRN
  Administered 2021-10-08: 1000 mL

## 2021-10-08 MED ORDER — OXYCODONE HCL 5 MG PO TABS: 5.0000 mg | ORAL_TABLET | Freq: Once | ORAL | Status: DC | PRN

## 2021-10-08 MED ORDER — HYDROMORPHONE HCL 2 MG PO TABS: 1.0000 mg | ORAL_TABLET | ORAL | Status: DC | PRN

## 2021-10-08 MED ORDER — ONDANSETRON HCL 4 MG PO TABS
4.0000 mg | ORAL_TABLET | Freq: Four times a day (QID) | ORAL | Status: DC | PRN
Start: 2021-10-08 — End: 2021-10-09

## 2021-10-08 MED ORDER — CEFAZOLIN SODIUM-DEXTROSE 1-4 GM/50ML-% IV SOLN
1.0000 g | Freq: Four times a day (QID) | INTRAVENOUS | Status: AC
  Administered 2021-10-08 – 2021-10-09 (×2): 1 g via INTRAVENOUS
  Filled 2021-10-08 (×2): qty 50

## 2021-10-08 MED ORDER — OXYCODONE HCL 5 MG/5ML PO SOLN: 5.0000 mg | Freq: Once | ORAL | Status: DC | PRN

## 2021-10-08 MED ORDER — PROPOFOL 10 MG/ML IV BOLUS
INTRAVENOUS | Status: DC | PRN
  Administered 2021-10-08 (×2): 20 mg via INTRAVENOUS

## 2021-10-08 MED ORDER — CHLORHEXIDINE GLUCONATE 0.12 % MT SOLN
15.0000 mL | Freq: Once | OROMUCOSAL | Status: AC
  Administered 2021-10-08: 15 mL via OROMUCOSAL
  Filled 2021-10-08: qty 15

## 2021-10-08 MED ORDER — ACETAMINOPHEN 325 MG PO TABS: 325.0000 mg | ORAL_TABLET | Freq: Four times a day (QID) | ORAL | Status: DC | PRN

## 2021-10-08 MED ORDER — OXYCODONE HCL 5 MG PO TABS: 10.0000 mg | ORAL_TABLET | ORAL | Status: DC | PRN

## 2021-10-08 MED ORDER — METOCLOPRAMIDE HCL 5 MG/ML IJ SOLN: 5.0000 mg | Freq: Three times a day (TID) | INTRAMUSCULAR | Status: DC | PRN

## 2021-10-08 MED ORDER — METOCLOPRAMIDE HCL 5 MG PO TABS: 5.0000 mg | ORAL_TABLET | Freq: Three times a day (TID) | ORAL | Status: DC | PRN

## 2021-10-08 MED ORDER — ACETAMINOPHEN 500 MG PO TABS
1000.0000 mg | ORAL_TABLET | Freq: Once | ORAL | Status: AC
  Administered 2021-10-08: 1000 mg via ORAL
  Filled 2021-10-08: qty 2

## 2021-10-08 MED ORDER — LOSARTAN POTASSIUM 50 MG PO TABS
50.0000 mg | ORAL_TABLET | Freq: Every day | ORAL | Status: DC
  Administered 2021-10-09: 50 mg via ORAL
  Filled 2021-10-08: qty 1

## 2021-10-08 MED ORDER — DIPHENHYDRAMINE HCL 12.5 MG/5ML PO ELIX: 12.5000 mg | ORAL_SOLUTION | ORAL | Status: DC | PRN

## 2021-10-08 MED ORDER — ALUM & MAG HYDROXIDE-SIMETH 200-200-20 MG/5ML PO SUSP: 30.0000 mL | ORAL | Status: DC | PRN

## 2021-10-08 MED ORDER — BUPIVACAINE IN DEXTROSE 0.75-8.25 % IT SOLN
INTRATHECAL | Status: DC | PRN
  Administered 2021-10-08: 15 mg via INTRATHECAL

## 2021-10-08 MED ORDER — SODIUM CHLORIDE 0.9 % IV SOLN: INTRAVENOUS | Status: DC

## 2021-10-08 MED ORDER — PHENOL 1.4 % MT LIQD: 1.0000 | OROMUCOSAL | Status: DC | PRN

## 2021-10-08 MED ORDER — CEFAZOLIN SODIUM-DEXTROSE 2-4 GM/100ML-% IV SOLN
2.0000 g | INTRAVENOUS | Status: AC
  Administered 2021-10-08: 2 g via INTRAVENOUS
  Filled 2021-10-08: qty 100

## 2021-10-08 MED ORDER — METHOCARBAMOL 1000 MG/10ML IJ SOLN
500.0000 mg | Freq: Four times a day (QID) | INTRAVENOUS | Status: DC | PRN
  Filled 2021-10-08: qty 5

## 2021-10-08 MED ORDER — OXYCODONE HCL 5 MG PO TABS: 5.0000 mg | ORAL_TABLET | ORAL | Status: DC | PRN

## 2021-10-08 MED ORDER — PROPOFOL 10 MG/ML IV BOLUS
INTRAVENOUS | Status: AC
  Filled 2021-10-08: qty 20

## 2021-10-08 MED ORDER — MIDAZOLAM HCL 2 MG/2ML IJ SOLN: 0.5000 mg | Freq: Once | INTRAMUSCULAR | Status: DC | PRN

## 2021-10-08 MED ORDER — MENTHOL 3 MG MT LOZG: 1.0000 | LOZENGE | OROMUCOSAL | Status: DC | PRN

## 2021-10-08 MED ORDER — TRANEXAMIC ACID-NACL 1000-0.7 MG/100ML-% IV SOLN
INTRAVENOUS | Status: AC
  Filled 2021-10-08: qty 100

## 2021-10-08 MED ORDER — HYDROMORPHONE HCL 2 MG PO TABS: 2.0000 mg | ORAL_TABLET | ORAL | Status: DC | PRN

## 2021-10-08 MED ORDER — PANTOPRAZOLE SODIUM 40 MG PO TBEC
40.0000 mg | DELAYED_RELEASE_TABLET | Freq: Every day | ORAL | Status: DC
  Administered 2021-10-08 – 2021-10-09 (×2): 40 mg via ORAL
  Filled 2021-10-08 (×2): qty 1

## 2021-10-08 MED ORDER — ONDANSETRON HCL 4 MG/2ML IJ SOLN: 4.0000 mg | Freq: Four times a day (QID) | INTRAMUSCULAR | Status: DC | PRN

## 2021-10-08 MED ORDER — DOCUSATE SODIUM 100 MG PO CAPS
100.0000 mg | ORAL_CAPSULE | Freq: Two times a day (BID) | ORAL | Status: DC
  Administered 2021-10-08 – 2021-10-09 (×2): 100 mg via ORAL
  Filled 2021-10-08 (×2): qty 1

## 2021-10-08 SURGICAL SUPPLY — 54 items
BAG COUNTER SPONGE SURGICOUNT (BAG) ×2 IMPLANT
BENZOIN TINCTURE PRP APPL 2/3 (GAUZE/BANDAGES/DRESSINGS) ×2 IMPLANT
BLADE CLIPPER SURG (BLADE) IMPLANT
BLADE SAW SGTL 18X1.27X75 (BLADE) ×2 IMPLANT
COVER SURGICAL LIGHT HANDLE (MISCELLANEOUS) ×2 IMPLANT
DRAPE C-ARM 42X72 X-RAY (DRAPES) ×2 IMPLANT
DRAPE STERI IOBAN 125X83 (DRAPES) ×2 IMPLANT
DRAPE U-SHAPE 47X51 STRL (DRAPES) ×6 IMPLANT
DRSG AQUACEL AG ADV 3.5X10 (GAUZE/BANDAGES/DRESSINGS) ×2 IMPLANT
DURAPREP 26ML APPLICATOR (WOUND CARE) ×2 IMPLANT
ELECT BLADE 4.0 EZ CLEAN MEGAD (MISCELLANEOUS) ×2
ELECT BLADE 6.5 EXT (BLADE) IMPLANT
ELECT REM PT RETURN 9FT ADLT (ELECTROSURGICAL) ×2
ELECTRODE BLDE 4.0 EZ CLN MEGD (MISCELLANEOUS) ×1 IMPLANT
ELECTRODE REM PT RTRN 9FT ADLT (ELECTROSURGICAL) ×1 IMPLANT
FACESHIELD WRAPAROUND (MASK) ×4 IMPLANT
FACESHIELD WRAPAROUND OR TEAM (MASK) ×2 IMPLANT
GLOVE SRG 8 PF TXTR STRL LF DI (GLOVE) ×2 IMPLANT
GLOVE SURG LTX SZ8 (GLOVE) ×2 IMPLANT
GLOVE SURG ORTHO LTX SZ7.5 (GLOVE) ×4 IMPLANT
GLOVE SURG UNDER POLY LF SZ8 (GLOVE) ×2
GOWN STRL REUS W/ TWL LRG LVL3 (GOWN DISPOSABLE) ×2 IMPLANT
GOWN STRL REUS W/ TWL XL LVL3 (GOWN DISPOSABLE) ×2 IMPLANT
GOWN STRL REUS W/TWL LRG LVL3 (GOWN DISPOSABLE) ×2
GOWN STRL REUS W/TWL XL LVL3 (GOWN DISPOSABLE) ×2
HANDPIECE INTERPULSE COAX TIP (DISPOSABLE) ×1
HEAD CERAMIC 36 PLUS5 (Hips) ×1 IMPLANT
KIT BASIN OR (CUSTOM PROCEDURE TRAY) ×2 IMPLANT
KIT TURNOVER KIT B (KITS) ×2 IMPLANT
LINER NEUTRAL 52X36MM PLUS 4 (Liner) ×1 IMPLANT
MANIFOLD NEPTUNE II (INSTRUMENTS) ×2 IMPLANT
NS IRRIG 1000ML POUR BTL (IV SOLUTION) ×2 IMPLANT
PACK TOTAL JOINT (CUSTOM PROCEDURE TRAY) ×2 IMPLANT
PAD ARMBOARD 7.5X6 YLW CONV (MISCELLANEOUS) ×2 IMPLANT
PIN SECTOR W/GRIP ACE CUP 52MM (Hips) ×1 IMPLANT
SCREW 6.5MMX25MM (Screw) ×1 IMPLANT
SET HNDPC FAN SPRY TIP SCT (DISPOSABLE) ×1 IMPLANT
STAPLER VISISTAT 35W (STAPLE) IMPLANT
STEM FEMORAL SZ6 HIGH ACTIS (Stem) ×1 IMPLANT
STRIP CLOSURE SKIN 1/2X4 (GAUZE/BANDAGES/DRESSINGS) ×4 IMPLANT
SUT ETHIBOND NAB CT1 #1 30IN (SUTURE) ×2 IMPLANT
SUT MNCRL AB 4-0 PS2 18 (SUTURE) IMPLANT
SUT VIC AB 0 CT1 27 (SUTURE) ×1
SUT VIC AB 0 CT1 27XBRD ANBCTR (SUTURE) ×1 IMPLANT
SUT VIC AB 1 CT1 27 (SUTURE) ×1
SUT VIC AB 1 CT1 27XBRD ANBCTR (SUTURE) ×1 IMPLANT
SUT VIC AB 2-0 CT1 27 (SUTURE) ×1
SUT VIC AB 2-0 CT1 TAPERPNT 27 (SUTURE) ×1 IMPLANT
TOWEL GREEN STERILE (TOWEL DISPOSABLE) ×2 IMPLANT
TOWEL GREEN STERILE FF (TOWEL DISPOSABLE) ×2 IMPLANT
TRAY CATH 16FR W/PLASTIC CATH (SET/KITS/TRAYS/PACK) IMPLANT
TRAY FOLEY W/BAG SLVR 16FR (SET/KITS/TRAYS/PACK)
TRAY FOLEY W/BAG SLVR 16FR ST (SET/KITS/TRAYS/PACK) IMPLANT
WATER STERILE IRR 1000ML POUR (IV SOLUTION) ×4 IMPLANT

## 2021-10-08 NOTE — Anesthesia Procedure Notes (Signed)
Spinal ? ?Start time: 10/08/2021 11:58 AM ?End time: 10/08/2021 12:02 PM ?Staffing ?Performed: anesthesiologist  ?Anesthesiologist: Jairo Ben, MD ?Preanesthetic Checklist ?Completed: patient identified, IV checked, site marked, risks and benefits discussed, surgical consent, monitors and equipment checked, pre-op evaluation and timeout performed ?Spinal Block ?Patient position: sitting ?Prep: DuraPrep and site prepped and draped ?Patient monitoring: blood pressure, continuous pulse ox, cardiac monitor and heart rate ?Approach: midline ?Location: L3-4 ?Injection technique: single-shot ?Needle ?Needle type: Pencan and Introducer  ?Needle gauge: 24 G ?Needle length: 9 cm ?Assessment ?Events: CSF return ?Additional Notes ?Pt identified in Operating room.  Monitors applied. Working IV access confirmed. Sterile prep, drape lumbar spine.  1% lido local L 3,4.  #24ga Pencan into clear CSF L 3,4.  15mg  0.75% Bupivacaine with dextrose injected with asp CSF beginning and end of injection.  Patient asymptomatic, VSS, no heme aspirated, tolerated well.  , MD ?  ? ? ? ?

## 2021-10-08 NOTE — H&P (Signed)
TOTAL HIP ADMISSION H&P ? ?Patient is admitted for right total hip arthroplasty. ? ?Subjective: ? ?Chief Complaint: right hip pain ? ?HPI: Nathan Hughes, 56 y.o. male, has a history of pain and functional disability in the right hip(s) due to arthritis and patient has failed non-surgical conservative treatments for greater than 12 weeks to include NSAID's and/or analgesics, corticosteriod injections, and activity modification.  Onset of symptoms was gradual starting 1 years ago with rapidlly worsening course since that time.The patient noted no past surgery on the right hip(s).  Patient currently rates pain in the right hip at 10 out of 10 with activity. Patient has night pain, worsening of pain with activity and weight bearing, pain that interfers with activities of daily living, and pain with passive range of motion. Patient has evidence of subchondral sclerosis, periarticular osteophytes, and joint space narrowing by imaging studies. This condition presents safety issues increasing the risk of falls.  There is no current active infection. ? ?Patient Active Problem List  ? Diagnosis Date Noted  ? Arthritis of right hip 10/08/2021  ? Left knee pain 06/04/2021  ? Right hip pain 05/10/2021  ? BMI 28.0-28.9,adult 04/12/2020  ? 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 Benign essential hypertension 10/12/2019  ? ?Past Medical History:  ?Diagnosis Date  ? Arthritis   ? Benign essential hypertension 10/12/2019  ? 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  ?Past Surgical History:  ?Procedure Laterality Date  ? INGUINAL HERNIA REPAIR Right 1997  ? VASECTOMY    ?  ?Current Facility-Administered Medications  ?Medication Dose Route Frequency Provider Last Rate Last Admin  ? ceFAZolin (ANCEF) IVPB 2g/100 mL premix  2 g Intravenous On Call to OR Pete Pelt, PA-C      ? lactated ringers infusion   Intravenous Continuous Duane Boston, MD 10 mL/hr at 10/08/21 1052 New Bag at 10/08/21 1052  ? tranexamic acid (CYKLOKAPRON) IVPB  1,000 mg  1,000 mg Intravenous To OR Pete Pelt, PA-C      ? ?Allergies  ?Allergen Reactions  ? Lisinopril Cough  ?  ?Social History  ? ?Tobacco Use  ? Smoking status: Never  ? Smokeless tobacco: Never  ?Substance Use Topics  ? Alcohol use: Yes  ?  Alcohol/week: 4.0 standard drinks  ?  Types: 4 Cans of beer per week  ?  Comment: ocassionally  ?  ?Family History  ?Problem Relation Age of Onset  ? Stroke Father   ? Heart disease Father   ? Hypokalemia Sister   ? Kidney disease Sister   ?  ? ?Review of Systems  ?All other systems reviewed and are negative. ? ?Objective: ? ?Physical Exam ?Vitals reviewed.  ?Constitutional:   ?   Appearance: Normal appearance.  ?HENT:  ?   Head: Normocephalic and atraumatic.  ?Eyes:  ?   Extraocular Movements: Extraocular movements intact.  ?   Pupils: Pupils are equal, round, and reactive to light.  ?Cardiovascular:  ?   Rate and Rhythm: Normal rate and regular rhythm.  ?Pulmonary:  ?   Effort: Pulmonary effort is normal.  ?   Breath sounds: Normal breath sounds.  ?Abdominal:  ?   Palpations: Abdomen is soft.  ?Musculoskeletal:  ?   Cervical back: Normal range of motion and neck supple.  ?   Right hip: Tenderness and bony tenderness present. Decreased range of motion. Decreased strength.  ?Neurological:  ?   Mental Status: He is alert and oriented to person, place, and time.  ?Psychiatric:     ?  Behavior: Behavior normal.  ? ? ?Vital signs in last 24 hours: ?Temp:  [98.1 ?F (36.7 ?C)] 98.1 ?F (36.7 ?C) (04/11 1026) ?Pulse Rate:  [73] 73 (04/11 1026) ?Resp:  [17] 17 (04/11 1026) ?BP: (129)/(85) 129/85 (04/11 1026) ?SpO2:  [98 %] 98 % (04/11 1026) ?Weight:  [86.2 kg] 86.2 kg (04/11 1026) ? ?Labs: ? ? ?Estimated body mass index is 28.06 kg/m? as calculated from the following: ?  Height as of this encounter: 5\' 9"  (1.753 m). ?  Weight as of this encounter: 86.2 kg. ? ? ?Imaging Review ?Plain radiographs demonstrate severe degenerative joint disease of the right hip(s). The bone  quality appears to be excellent for age and reported activity level. ? ? ? ? ? ?Assessment/Plan: ? ?End stage arthritis, right hip(s) ? ?The patient history, physical examination, clinical judgement of the provider and imaging studies are consistent with end stage degenerative joint disease of the right hip(s) and total hip arthroplasty is deemed medically necessary. The treatment options including medical management, injection therapy, arthroscopy and arthroplasty were discussed at length. The risks and benefits of total hip arthroplasty were presented and reviewed. The risks due to aseptic loosening, infection, stiffness, dislocation/subluxation,  thromboembolic complications and other imponderables were discussed.  The patient acknowledged the explanation, agreed to proceed with the plan and consent was signed. Patient is being admitted for inpatient treatment for surgery, pain control, PT, OT, prophylactic antibiotics, VTE prophylaxis, progressive ambulation and ADL's and discharge planning.The patient is planning to be discharged home with home health services ? ? ? ?

## 2021-10-08 NOTE — Anesthesia Postprocedure Evaluation (Signed)
Anesthesia Post Note ? ?Patient: Nathan Hughes ? ?Procedure(s) Performed: Right TOTAL HIP ARTHROPLASTY ANTERIOR APPROACH (Right: Hip) ? ?  ? ?Patient location during evaluation: PACU ?Anesthesia Type: Spinal ?Level of consciousness: awake and alert, patient cooperative and oriented ?Pain management: pain level controlled ?Vital Signs Assessment: post-procedure vital signs reviewed and stable ?Respiratory status: spontaneous breathing, nonlabored ventilation and respiratory function stable ?Cardiovascular status: blood pressure returned to baseline and stable ?Postop Assessment: no apparent nausea or vomiting and spinal receding ?Anesthetic complications: no ? ? ?No notable events documented. ? ?Last Vitals:  ?Vitals:  ? 10/08/21 1330 10/08/21 1345  ?BP: 117/84 (!) 82/57  ?Pulse: 72 73  ?Resp: 11 (!) 23  ?Temp: 36.6 ?C   ?SpO2: 96% 94%  ?  ?Last Pain:  ?Vitals:  ? 10/08/21 1330  ?PainSc: 0-No pain  ? ? ?  ?  ?  ?  ?  ?  ? ?Natasja Niday,E. Koleman Marling ? ? ? ? ?

## 2021-10-08 NOTE — Op Note (Signed)
NAME: Nathan Hughes, ALTLAND ?MEDICAL RECORD NO: [id number] ?ACCOUNT NO: [id number] ?DATE OF BIRTH: 02/17/1966 ?FACILITY: [facility] ?LOCATION: [facility] ?PHYSICIAN: Vanita Panda. Magnus Ivan, MD ? ?Operative Report  ? ?DATE OF PROCEDURE: 10/08/2021 ? ?PREOPERATIVE DIAGNOSIS:  Primary osteoarthritis and degenerative joint disease, right hip. ? ?POSTOPERATIVE DIAGNOSIS:  Primary osteoarthritis and degenerative joint disease, right hip. ? ?PROCEDURE:  Right total hip arthroplasty through direct anterior approach. ? ?IMPLANTS:  DePuy sector Gription acetabular component, size 52 with a single screw, size 36+4 polyethylene liner, size 6 ACTIS femoral component with high offset, size 36+5 ceramic hip ball. ? ?SURGEON: Doneen Poisson, MD ? ?ASSISTANT:  Rexene Edison, PA-C ? ?ANESTHESIA:  Spinal. ? ?ANTIBIOTICS:  2 g IV Ancef. ? ?ESTIMATED BLOOD LOSS:  500 mL. ? ?COMPLICATIONS:  None. ? ?INDICATIONS:  The patient is a 56 year old very athletic muscular gentleman with an unfortunate debilitating arthritis involving his right hip.  This has gotten worse over the last year.  He has tried and failed all forms of conservative treatment.  His  ?x-rays do show significant arthritis of the right hip.  His right hip pain is daily and is detrimentally affecting his mobility, his quality of life and activities of daily living to the point he does wish to proceed with total hip arthroplasty.  We  ?talked about the surgery in detail including the risk of acute blood loss anemia, nerve or vessel injury, fracture, infection, dislocation, DVT, implant failure, leg length differences and skin and soft tissue issues.  We talked about our goals being  ?decreased pain, improve mobility and overall improve quality of life. ? ?DESCRIPTION OF PROCEDURE:  After informed consent was obtained, appropriate right hip was marked.  He was brought to the operating room and sat at the stretcher where spinal anesthesia was obtained.  He was laid in supine position on  the stretcher.   ?Foley catheter was placed.  We assessed his leg length and he is definitely shorter on his right side than his left.  Traction boots were placed on both his feet.  He was placed supine on the Hana fracture table, with the perineal post in place and both  ?legs in line skeletal traction device and no traction applied.  His right operative hip was prepped and draped with DuraPrep and sterile drapes.  A timeout was called and he was identified as correct patient, correct right hip.  I then made an incision  ?just inferior and posterior to the anterior superior iliac spine and carried this obliquely down the leg.  We dissected down tensor fascia lata muscle.  Tensor fascia was then divided longitudinally to proceed with direct anterior approach to the hip.   ?We identified and cauterized the circumflex vessels and identified the hip capsule, opened up the hip capsule in L-type format, finding a moderate joint effusion.  We placed curved retractors around the medial and lateral femoral neck within the joint  ?capsule area and made a femoral neck cut with oscillating saw, just proximal to the lesser trochanter and completed this with an osteotome.  We placed a corkscrew guide in the femoral head and removed the femoral head in its entirety and found a wide  ?area devoid of cartilage.  I then placed a bent Hohmann over the medial acetabular rim and removed remnants of acetabular labrum and other debris and began reaming under direct visualization from a size 43 reamer in a stepwise increments going up to size ? 51 reamer with all reamers placed under direct visualization, the  last reamer was also placed under direct fluoroscopy, so I could obtain my depth of reaming, my inclination and anteversion.  I then placed real DePuy sector Gription acetabular component ? size 52 and a single screw due to slight overhang of the hip socket and this was just based on his anatomy.  We did go with 36+4 liner based off  his offset.  Attention was then turned to the femur.  With the leg externally rotated to 120 degrees and  ?extended and adducted, we were able to place a Mueller retractor medially and Hohman retractor behind the greater trochanter.  We released lateral joint capsule and used a box cutting osteotome to enter the femoral canal and a rongeur to lateralize.  We  ?then began broaching using the ACTIS broaching system from a size 0 going all the way to size 6.  With a size 6 in place, we trialed a standard offset femoral neck and a 36+1.5 head ball, reduced this in the pelvis and based on radiographic assessment  ?and mechanical assessment, we felt like he needed more offset and leg length.  We dislocated the hip, removed the trial components.  We placed the real ACTIS femoral component, size 6, but with a high offset component.  We went in with a real 36+5  ?ceramic hip ball and again reduced this in the acetabulum.  We were pleased with leg length, offset, range of motion and stability, assessed mechanically and radiographically.  We then irrigated the soft tissue with normal saline solution using pulsatile ? lavage.  The joint capsule was closed with interrupted #1 Ethibond suture followed by #1 Vicryl to close the tensor fascia.  0 Vicryl was used to close the deep tissue, 2-0 Vicryl was used to close subcutaneous tissue.  The skin was closed with staples. ?  An Aquacel dressing was applied.  He was taken off the Hana table and taken to recovery room in stable condition with all final counts being correct.  No complications noted.  ? ?Of note, Rexene Edison, PA-C, assisted during the entire case from beginning to end.  His assistance was crucial for soft tissue management and retracting of soft tissues as well as assistance with guiding of the implants.  He was involved in a layered  ?closure of the wound.  His involvement was medically necessary. ? ? ?[facility] ?D: 10/08/2021 1:08:22 pm T: 10/08/2021 10:32:00 pm  ?JOB:  [id number]/ [id number]  ?

## 2021-10-08 NOTE — Anesthesia Procedure Notes (Signed)
Procedure Name: [place] [facility] ?Date/Time: 10/08/2021 11:56 AM ?Performed by: Griffin Dakin, CRNA ?Pre-anesthesia Checklist: Emergency Drugs available, Suction available, Patient identified, Patient being monitored and Timeout performed ?Patient Re-evaluated:Patient Re-evaluated prior to induction ?Oxygen Delivery Method: Simple face mask ?Preoxygenation: Pre-oxygenation with 100% oxygen ?Induction Type: IV induction ?Placement Confirmation: positive ETCO2 and breath sounds checked- equal and bilateral ?Dental Injury: Teeth and Oropharynx as per pre-operative assessment  ? ? ? ? ?

## 2021-10-08 NOTE — Evaluation (Signed)
Physical Therapy Evaluation ?Patient Details ?Name: Nathan Hughes ?MRN: [id number] ?DOB: 1965/11/18 ?Today's Date: 10/08/2021 ? ?History of Present Illness ? Pt is 56 yo male s/p R anterior THA 10/08/21.  Pt with hx of arthritis and inguinal hernia repair.  ?Clinical Impression ? Pt is s/p THA resulting in the deficits listed below (see PT Problem List). Pt independent at baseline. At evaluation, pt did very well - good pain control, muscle activation, and ambulated 100' without difficulty.  Pt very motivated and expected to progress well. Pt will benefit from skilled PT to increase their independence and safety with mobility to allow discharge to the venue listed below.  ? ?   ?   ? ?Recommendations for follow up therapy are one component of a multi-disciplinary discharge planning process, led by the attending physician.  Recommendations may be updated based on patient status, additional functional criteria and insurance authorization. ? ?Follow Up Recommendations Follow physician's recommendations for discharge plan and follow up therapies ? ?  ?Assistance Recommended at Discharge PRN  ?Patient can return home with the following ? A little help with walking and/or transfers;A little help with bathing/dressing/bathroom;Assistance with cooking/housework;Help with stairs or ramp for entrance ? ?  ?Equipment Recommendations Rolling walker (2 wheels)  ?Recommendations for Other Services ?    ?  ?Functional Status Assessment Patient has had a recent decline in their functional status and demonstrates the ability to make significant improvements in function in a reasonable and predictable amount of time.  ? ?  ?Precautions / Restrictions Precautions ?Precautions: None ?Restrictions ?Other Position/Activity Restrictions: WBAT  ? ?  ? ?Mobility ? Bed Mobility ?Overal bed mobility: Needs Assistance ?Bed Mobility: Supine to Sit, Sit to Supine ?  ?  ?Supine to sit: Min guard ?Sit to supine: Min guard ?  ?  ?   ? ?Transfers ?Overall transfer level: Needs assistance ?Equipment used: Rolling walker (2 wheels) ?Transfers: Sit to/from Stand ?Sit to Stand: Min guard ?  ?  ?  ?  ?  ?General transfer comment: cues for hand placement and R LE management ?  ? ?Ambulation/Gait ?Ambulation/Gait assistance: Min guard ?Gait Distance (Feet): 100 Feet ?Assistive device: Rolling walker (2 wheels) ?Gait Pattern/deviations: Step-through pattern, Decreased stride length ?Gait velocity: decreased ?  ?  ?General Gait Details: Cues for sequencing, pt performed step thruogh pattern without difficulty; cues to push RW ? ?Stairs ?  ?  ?  ?  ?  ? ?Wheelchair Mobility ?  ? ?Modified Rankin (Stroke Patients Only) ?  ? ?  ? ?Balance Overall balance assessment: Needs assistance ?Sitting-balance support: No upper extremity supported ?Sitting balance-Leahy Scale: Normal ?  ?  ?Standing balance support: Bilateral upper extremity supported, No upper extremity supported ?Standing balance-Leahy Scale: Fair ?Standing balance comment: RW to ambulate but could stand without AD ?  ?  ?  ?  ?  ?  ?  ?  ?  ?  ?  ?   ? ? ? ?Pertinent Vitals/Pain Pain Assessment ?Pain Assessment: 0-10 ?Pain Score: 1  ?Pain Location: R hip ?Pain Descriptors / Indicators: Burning ?Pain Intervention(s): Limited activity within patient's tolerance, Monitored during session  ? ? ?Home Living Family/patient expects to be discharged to:: Private residence ?Living Arrangements: Spouse/significant other ?Available Help at Discharge: Family;Available PRN/intermittently ?Type of Home: House ?Home Access: Stairs to enter ?Entrance Stairs-Rails: Left ?Entrance Stairs-Number of Steps: flight with landing ?  ?Home Layout: One level ?Home Equipment: None ?   ?  ?Prior Function Prior Level  of Function : Independent/Modified Independent;Working/employed;Driving ?  ?  ?  ?  ?  ?  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  ?  ? ? ?Hand Dominance  ?   ? ?  ?Extremity/Trunk  Assessment  ? Upper Extremity Assessment ?Upper Extremity Assessment: Overall WFL for tasks assessed ?  ? ?Lower Extremity Assessment ?Lower Extremity Assessment: LLE deficits/detail ?RLE Deficits / Details: ROM WFL; MMT: 3/5 not further tested ?RLE Sensation: WNL ?LLE Deficits / Details: L knee with edema - pt reports started in [place], feels it is from compensating for R LE ?LLE Sensation: WNL ?  ? ?Cervical / Trunk Assessment ?Cervical / Trunk Assessment: Normal  ?Communication  ? Communication: No difficulties  ?Cognition Arousal/Alertness: Awake/alert ?Behavior During Therapy: [facility] for tasks assessed/performed ?Overall Cognitive Status: Within Functional Limits for tasks assessed ?  ?  ?  ?  ?  ?  ?  ?  ?  ?  ?  ?  ?  ?  ?  ?  ?  ?  ?  ? ?  ?General Comments General comments (skin integrity, edema, etc.): Pt hoping to manage without narcotics.  Did discuss importance of keeping pain under control ? ?  ?Exercises    ? ?Assessment/Plan  ?  ?PT Assessment Patient needs continued PT services  ?PT Problem List Decreased strength;Decreased mobility;Decreased range of motion;Decreased activity tolerance;Decreased balance;Decreased knowledge of use of DME ? ?   ?  ?PT Treatment Interventions DME instruction;Therapeutic activities;Modalities;Gait training;Therapeutic exercise;Patient/family education;Stair training;Balance training;Functional mobility training   ? ?PT Goals (Current goals can be found in the Care Plan section)  ?Acute Rehab PT Goals ?Patient Stated Goal: return home; ride [place] ?PT Goal Formulation: With patient/family ?Time For Goal Achievement: 10/22/21 ?Potential to Achieve Goals: Good ? ?  ?Frequency 7X/week ?  ? ? ?Co-evaluation   ?  ?  ?  ?  ? ? ?  ?AM-PAC PT "6 Clicks" Mobility  ?Outcome Measure Help needed turning from your back to your side while in a flat bed without using bedrails?: None ?Help needed moving from lying on your back to sitting on the side of a flat bed without using  bedrails?: A Little ?Help needed moving to and from a bed to a chair (including a wheelchair)?: A Little ?Help needed standing up from a chair using your arms (e.g., wheelchair or bedside chair)?: A Little ?Help needed to walk in hospital room?: A Little ?Help needed climbing 3-5 steps with a railing? : A Little ?6 Click Score: 19 ? ?  ?End of Session Equipment Utilized During Treatment: Gait belt ?Activity Tolerance: Patient tolerated treatment well ?Patient left: in bed;with call bell/phone within reach;with family/visitor present ?Nurse Communication: Mobility status ?PT Visit Diagnosis: Other abnormalities of gait and mobility (R26.89);Muscle weakness (generalized) (M62.81) ?  ? ?Time: 1730-1800 ?PT Time Calculation (min) (ACUTE ONLY): 30 min ? ? ?Charges:   PT Evaluation ?$PT Eval Low Complexity: 1 Low ?PT Treatments ?$Gait Training: 8-22 mins ?  ?   ? ? ?Anise Salvo, PT ?Acute Rehab Services ?Pager [phone] ?[facility] [phone] ? ? ?Billey Chang Chanele Douglas ?10/08/2021, 6:20 PM ? ?

## 2021-10-08 NOTE — Brief Op Note (Signed)
10/08/2021 ? ?1:09 PM ? ?PATIENT:  Nathan Hughes  56 y.o. male ? ?PRE-OPERATIVE DIAGNOSIS:  Endstage Right Hip Osteoarthritis ? ?POST-OPERATIVE DIAGNOSIS:  Endstage Right Hip Osteoarthritis ? ?PROCEDURE:  Procedure(s): ?Right TOTAL HIP ARTHROPLASTY ANTERIOR APPROACH (Right) ? ?SURGEON:  Surgeon(s) and Role: ?   Mcarthur Rossetti, MD - Primary ? ?PHYSICIAN ASSISTANT:  Benita Stabile, PA-C ? ?ANESTHESIA:   spinal ? ?EBL:  500 mL  ? ?COUNTS:  YES ? ?DICTATION: .Other Dictation: Dictation Number [id number] ? ?PLAN OF CARE: Admit for overnight observation ? ?PATIENT DISPOSITION:  PACU - hemodynamically stable. ?  ?Delay start of Pharmacological VTE agent (>24hrs) due to surgical blood loss or risk of bleeding: no ? ?

## 2021-10-08 NOTE — Plan of Care (Signed)

## 2021-10-08 NOTE — Transfer of Care (Signed)
Immediate Anesthesia Transfer of Care Note ? ?Patient: Nathan Hughes ? ?Procedure(s) Performed: Right TOTAL HIP ARTHROPLASTY ANTERIOR APPROACH (Right: Hip) ? ?Patient Location: PACU ? ?Anesthesia Type:Spinal ? ?Level of Consciousness: awake, alert  and oriented ? ?Airway & Oxygen Therapy: Patient Spontanous Breathing ? ?Post-op Assessment: Report given to RN and Post -op Vital signs reviewed and stable ? ?Post vital signs: Reviewed and stable ? ?Last Vitals:  ?Vitals Value Taken Time  ?BP 117/84 10/08/21 1327  ?Temp    ?Pulse 73 10/08/21 1328  ?Resp 12 10/08/21 1328  ?SpO2 96 % 10/08/21 1328  ?Vitals shown include unvalidated device data. ? ?Last Pain:  ?Vitals:  ? 10/08/21 1037  ?PainSc: 0-No pain  ?   ? ?  ? ?Complications: No notable events documented. ?

## 2021-10-09 ENCOUNTER — Encounter (HOSPITAL_COMMUNITY): Payer: Self-pay | Admitting: Orthopaedic Surgery

## 2021-10-09 DIAGNOSIS — R531 Weakness: Secondary | ICD-10-CM | POA: Diagnosis not present

## 2021-10-09 DIAGNOSIS — M1611 Unilateral primary osteoarthritis, right hip: Secondary | ICD-10-CM | POA: Diagnosis not present

## 2021-10-09 DIAGNOSIS — I10 Essential (primary) hypertension: Secondary | ICD-10-CM | POA: Diagnosis not present

## 2021-10-09 LAB — CBC
HCT: 33.3 % — ABNORMAL LOW (ref 39.0–52.0)
Hemoglobin: 10.9 g/dL — ABNORMAL LOW (ref 13.0–17.0)
MCH: 32.3 pg (ref 26.0–34.0)
MCHC: 32.7 g/dL (ref 30.0–36.0)
MCV: 98.8 fL (ref 80.0–100.0)
Platelets: 228 10*3/uL (ref 150–400)
RBC: 3.37 MIL/uL — ABNORMAL LOW (ref 4.22–5.81)
RDW: 12.7 % (ref 11.5–15.5)
WBC: 12.5 10*3/uL — ABNORMAL HIGH (ref 4.0–10.5)
nRBC: 0 % (ref 0.0–0.2)

## 2021-10-09 LAB — BASIC METABOLIC PANEL
Anion gap: 4 — ABNORMAL LOW (ref 5–15)
BUN: 11 mg/dL (ref 6–20)
CO2: 28 mmol/L (ref 22–32)
Calcium: 8.6 mg/dL — ABNORMAL LOW (ref 8.9–10.3)
Chloride: 104 mmol/L (ref 98–111)
Creatinine, Ser: 0.69 mg/dL (ref 0.61–1.24)
GFR, Estimated: >60 mL/min (ref >60)
Glucose, Bld: 198 mg/dL — ABNORMAL HIGH (ref 70–99)
Potassium: 4.5 mmol/L (ref 3.5–5.1)
Sodium: 136 mmol/L (ref 135–145)

## 2021-10-09 MED ORDER — ASPIRIN 81 MG PO CHEW
81.0000 mg | CHEWABLE_TABLET | Freq: Two times a day (BID) | ORAL | 0 refills | Status: AC
Start: 2021-10-09 — End: ?

## 2021-10-09 MED ORDER — ACETAMINOPHEN-CODEINE #3 300-30 MG PO TABS
1.0000 | ORAL_TABLET | Freq: Four times a day (QID) | ORAL | Status: DC | PRN
Start: 2021-10-09 — End: 2021-10-09

## 2021-10-09 MED ORDER — GABAPENTIN 300 MG PO CAPS: 300.0000 mg | ORAL_CAPSULE | Freq: Every day | ORAL | Status: DC

## 2021-10-09 MED ORDER — GABAPENTIN 300 MG PO CAPS: 300.0000 mg | ORAL_CAPSULE | Freq: Every day | ORAL | 0 refills | Status: DC

## 2021-10-09 MED ORDER — ACETAMINOPHEN-CODEINE #3 300-30 MG PO TABS: 1.0000 | ORAL_TABLET | Freq: Four times a day (QID) | ORAL | 0 refills | Status: DC | PRN

## 2021-10-09 MED ORDER — METHOCARBAMOL 500 MG PO TABS: 500.0000 mg | ORAL_TABLET | Freq: Four times a day (QID) | ORAL | 1 refills | Status: DC | PRN

## 2021-10-09 NOTE — Progress Notes (Signed)
Subjective: ?1 Day Post-Op Procedure(s) (LRB): ?Right TOTAL HIP ARTHROPLASTY ANTERIOR APPROACH (Right) ?Patient reports pain as moderate.  Some burning pain about incision yesterday. No complaints otherwise.  ? ?Objective: ?Vital signs in last 24 hours: ?Temp:  [97.4 ?F (36.3 ?C)-98.6 ?F (37 ?C)] 98.6 ?F (37 ?C) (04/12 8786) ?Pulse Rate:  [56-95] 82 (04/12 0733) ?Resp:  [11-23] 17 (04/12 0733) ?BP: (90-136)/(61-85) 107/74 (04/12 7672) ?SpO2:  [94 %-100 %] 100 % (04/12 0733) ?Weight:  [86.2 kg] 86.2 kg (04/11 1026) ? ?Intake/Output from previous day: ?04/11 0701 - 04/12 0700 ?In: 1000 [I.V.:1000] ?Out: 2975 [Urine:2475; Blood:500] ?Intake/Output this shift: ?No intake/output data recorded. ? ?Recent Labs  ?  10/09/21 ?0312  ?HGB 10.9*  ? ?Recent Labs  ?  10/09/21 ?0312  ?WBC 12.5*  ?RBC 3.37*  ?HCT 33.3*  ?PLT 228  ? ?Recent Labs  ?  10/09/21 ?0312  ?NA 136  ?K 4.5  ?CL 104  ?CO2 28  ?BUN 11  ?CREATININE 0.69  ?GLUCOSE 198*  ?CALCIUM 8.6*  ? ?No results for input(s): LABPT, INR in the last 72 hours. ? ?Sensation intact distally ?Dorsiflexion/Plantar flexion intact ?Incision: dressing C/D/I ?Compartment soft ? ? ?Assessment/Plan: ?1 Day Post-Op Procedure(s) (LRB): ?Right TOTAL HIP ARTHROPLASTY ANTERIOR APPROACH (Right) ?Up with therapy ?Discharge to home if patient remains stable and does well with PT.  ? ? ? ? ?Nathan Hughes ?10/09/2021, 7:54 AM ? ?

## 2021-10-09 NOTE — Discharge Summary (Signed)
Patient ID: ?Nathan Hughes ?MRN: [id number] ?DOB/AGE: 1965/07/18 56 y.o. ? ?Admit date: 10/08/2021 ?Discharge date: 10/09/2021 ? ?Admission Diagnoses:  ?Principal Problem: ?  Arthritis of right hip ?Active Problems: ?  Status post total replacement of right hip ? ? ?Discharge Diagnoses:  ?Same ? ?Past Medical History:  ?Diagnosis Date  ? Arthritis   ? Benign essential hypertension 10/12/2019  ? 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 ? ?Surgeries: Procedure(s): ?Right TOTAL HIP ARTHROPLASTY ANTERIOR APPROACH on 10/08/2021 ?  ?Consultants:  ? ?Discharged Condition: Improved ? ?Hospital Course: Nathan Hughes is an 56 y.o. male who was admitted 10/08/2021 for operative treatment ofArthritis of right hip. Patient has severe unremitting pain that affects sleep, daily activities, and work/hobbies. After pre-op clearance the patient was taken to the operating room on 10/08/2021 and underwent  Procedure(s): ?Right TOTAL HIP ARTHROPLASTY ANTERIOR APPROACH.   ? ?Patient was given perioperative antibiotics:  ?Anti-infectives (From admission, onward)  ? ? Start     Dose/Rate Route Frequency Ordered Stop  ? 10/08/21 1800  ceFAZolin (ANCEF) IVPB 1 g/50 mL premix       ? 1 g ?100 mL/hr over 30 Minutes Intravenous Every 6 hours 10/08/21 1510 10/09/21 0151  ? 10/08/21 1015  ceFAZolin (ANCEF) IVPB 2g/100 mL premix       ? 2 g ?200 mL/hr over 30 Minutes Intravenous On call to O.R. 10/08/21 1007 10/08/21 1215  ? ?  ?  ? ?Patient was given sequential compression devices, early ambulation, and chemoprophylaxis to prevent DVT. ? ?Patient benefited maximally from hospital stay and there were no complications.   ? ?Recent vital signs: Patient Vitals for the past 24 hrs: ? BP Temp Temp src Pulse Resp SpO2 Height Weight  ?10/09/21 0733 107/74 98.6 ?F (37 ?C) Oral 82 17 100 % -- --  ?10/08/21 2107 136/78 98.6 ?F (37 ?C) Oral 95 18 99 % -- --  ?10/08/21 1645 115/84 (!) 97.4 ?F (36.3 ?C) Oral 79 17 100 % -- --  ?10/08/21 1630 113/74 (!) 97.4  ?F (36.3 ?C) -- 84 19 99 % -- --  ?10/08/21 1615 105/68 -- -- 68 18 100 % -- --  ?10/08/21 1600 109/71 -- -- 68 14 100 % -- --  ?10/08/21 1545 108/65 -- -- 66 16 100 % -- --  ?10/08/21 1530 128/72 97.7 ?F (36.5 ?C) -- (!) 59 13 100 % -- --  ?10/08/21 1515 104/69 -- -- (!) 58 14 100 % -- --  ?10/08/21 1500 104/79 -- -- (!) 58 16 100 % -- --  ?10/08/21 1445 101/69 -- -- (!) 56 20 100 % -- --  ?10/08/21 1430 97/64 -- -- (!) 57 18 100 % -- --  ?10/08/21 1415 99/68 -- -- (!) 58 17 98 % -- --  ?10/08/21 1400 92/61 -- -- 63 13 100 % -- --  ?10/08/21 1345 90/62 -- -- 73 (!) 23 94 % -- --  ?10/08/21 1330 117/84 97.9 ?F (36.6 ?C) -- 72 11 96 % -- --  ?10/08/21 1026 129/85 98.1 ?F (36.7 ?C) -- 73 17 98 % 5\' 9"  (1.753 m) 86.2 kg  ?  ? ?Recent laboratory studies:  ?Recent Labs  ?  10/09/21 ?0312  ?WBC 12.5*  ?HGB 10.9*  ?HCT 33.3*  ?PLT 228  ?NA 136  ?K 4.5  ?CL 104  ?CO2 28  ?BUN 11  ?CREATININE 0.69  ?GLUCOSE 198*  ?CALCIUM 8.6*  ? ? ? ?Discharge Medications:   ?Allergies as of 10/09/2021   ? ?  Reactions  ? Lisinopril Cough  ? ?  ? ?  ?Medication List  ?  ? ?STOP taking these medications   ? ?aspirin 81 MG EC tablet ?Replaced by: aspirin 81 MG chewable tablet ?  ?ibuprofen 200 MG tablet ?Commonly known as: ADVIL ?  ? ?  ? ?TAKE these medications   ? ?acetaminophen-codeine 300-30 MG tablet ?Commonly known as: TYLENOL #3 ?Take 1 tablet by mouth every 6 (six) hours as needed for moderate pain. ?  ?aspirin 81 MG chewable tablet ?Chew 1 tablet (81 mg total) by mouth 2 (two) times daily. ?Replaces: aspirin 81 MG EC tablet ?  ?Fish Oil 1000 MG Caps ?Take 1,000 mg by mouth daily. ?  ?gabapentin 300 MG capsule ?Commonly known as: NEURONTIN ?Take 1 capsule (300 mg total) by mouth at bedtime. ?  ?Glucosamine-Chondroitin Tabs ?Take 2 tablets by mouth daily. ?  ?losartan 50 MG tablet ?Commonly known as: COZAAR ?Take 1 tablet by mouth once daily ?  ?Mens Multivitamin Chew ?Chew 2 tablets by mouth daily. ?  ?methocarbamol 500 MG  tablet ?Commonly known as: ROBAXIN ?Take 1 tablet (500 mg total) by mouth every 6 (six) hours as needed for muscle spasms. ?  ?testosterone cypionate 200 MG/ML injection ?Commonly known as: DEPOTESTOSTERONE CYPIONATE ?Inject 1 mL (200 mg total) into the muscle every 14 (fourteen) days. ?  ? ?  ? ?  ?  ? ? ?  ?Durable Medical Equipment  ?(From admission, onward)  ?  ? ? ?  ? ?  Start     Ordered  ? 10/08/21 1723  DME 3 n 1  Once       ? 10/08/21 1722  ? 10/08/21 1723  DME Walker rolling  Once       ?Question Answer Comment  ?Walker: With 5 Inch Wheels   ?Patient needs a walker to treat with the following condition Status post total replacement of right hip   ?  ? 10/08/21 1722  ? ?  ?  ? ?  ? ? ?Diagnostic Studies: DG Pelvis Portable ? ?Result Date: 10/08/2021 ?CLINICAL DATA:  Postoperative EXAM: PORTABLE PELVIS 1-2 VIEWS COMPARISON:  None. FINDINGS: Status post right hip total arthroplasty with expected overlying postoperative change. No perihardware fracture or component malpositioning. IMPRESSION: Status post right hip total arthroplasty with expected overlying postoperative change. No perihardware fracture or component malpositioning. Electronically Signed   By: Jearld Lesch M.D.   On: 10/08/2021 14:21  ? ?DG C-Arm 1-60 Min-No Report ? ?Result Date: 10/08/2021 ?Fluoroscopy was utilized by the requesting physician.  No radiographic interpretation.  ? ?DG HIP UNILAT WITH PELVIS 1V RIGHT ? ?Result Date: 10/08/2021 ?CLINICAL DATA:  Fluoroscopic assistance for right hip arthroplasty EXAM: DG HIP (WITH OR WITHOUT PELVIS) 1V RIGHT COMPARISON:  05/10/2021 FINDINGS: Fluoroscopic images show interval right hip arthroplasty. No fracture is seen. Fluoroscopic time was 17 seconds. Radiation dose was 1.98 mGy. IMPRESSION: Fluoroscopic assistance was provided for right hip arthroplasty. Electronically Signed   By: Ernie Avena M.D.   On: 10/08/2021 13:25   ? ?Disposition: Discharge disposition: 06-Home-Health Care  Svc ? ? ? ? ? ? ? ? ? Follow-up Information   ? ? Kathryne Hitch, MD. Call in 2 week(s).   ?Specialty: Orthopedic Surgery ?Contact information: ?[address] ?[place] [address] ?[phone] ? ? ?  ?  ? ?  ?  ? ?  ? ? ? ?Signed: ?Eboni Coval ?10/09/2021, 8:27 AM ? ?  ? ?

## 2021-10-09 NOTE — Discharge Instructions (Signed)
Dental Antibiotics: ?INSTRUCTIONS AFTER JOINT REPLACEMENT  ? ?Remove items at home which could result in a fall. This includes throw rugs or furniture in walking pathways ?ICE to the affected joint every three hours while awake for 30 minutes at a time, for at least the first 3-5 days, and then as needed for pain and swelling.  Continue to use ice for pain and swelling. You may notice swelling that will progress down to the foot and ankle.  This is normal after surgery.  Elevate your leg when you are not up walking on it.   ?Continue to use the breathing machine you got in the hospital (incentive spirometer) which will help keep your temperature down.  It is common for your temperature to cycle up and down following surgery, especially at night when you are not up moving around and exerting yourself.  The breathing machine keeps your lungs expanded and your temperature down. ? ? ?DIET:  As you were doing prior to hospitalization, we recommend a well-balanced diet. ? ?DRESSING / WOUND CARE / SHOWERING ? ?Keep the surgical dressing until follow up.  The dressing is water proof, so you can shower without any extra covering.  IF THE DRESSING FALLS OFF or the wound gets wet inside, change the dressing with sterile gauze.  Please use good hand washing techniques before changing the dressing.  Do not use any lotions or creams on the incision until instructed by your surgeon.   ? ?ACTIVITY ? ?Increase activity slowly as tolerated, but follow the weight bearing instructions below.   ?No driving for 6 weeks or until further direction given by your physician.  You cannot drive while taking narcotics.  ?No lifting or carrying greater than 10 lbs. until further directed by your surgeon. ?Avoid periods of inactivity such as sitting longer than an hour when not asleep. This helps prevent blood clots.  ?You may return to work once you are authorized by your doctor.  ? ? ? ?WEIGHT BEARING  ? ?Weight bearing as tolerated with assist  device (walker, cane, etc) as directed, use it as long as suggested by your surgeon or therapist, typically at least 4-6 weeks. ? ? ?EXERCISES ? ?Results after joint replacement surgery are often greatly improved when you follow the exercise, range of motion and muscle strengthening exercises prescribed by your doctor. Safety measures are also important to protect the joint from further injury. Any time any of these exercises cause you to have increased pain or swelling, decrease what you are doing until you are comfortable again and then slowly increase them. If you have problems or questions, call your caregiver or physical therapist for advice.  ? ?Rehabilitation is important following a joint replacement. After just a few days of immobilization, the muscles of the leg can become weakened and shrink (atrophy).  These exercises are designed to build up the tone and strength of the thigh and leg muscles and to improve motion. Often times heat used for twenty to thirty minutes before working out will loosen up your tissues and help with improving the range of motion but do not use heat for the first two weeks following surgery (sometimes heat can increase post-operative swelling).  ? ?These exercises can be done on a training (exercise) mat, on the floor, on a table or on a bed. Use whatever works the best and is most comfortable for you.    Use music or television while you are exercising so that the exercises are a pleasant break  in your day. This will make your life better with the exercises acting as a break in your routine that you can look forward to.   Perform all exercises about fifteen times, three times per day or as directed.  You should exercise both the operative leg and the other leg as well. ? ?Exercises include: ?  ?Quad Sets - Tighten up the muscle on the front of the thigh (Quad) and hold for 5-10 seconds.   ?Straight Leg Raises - With your knee straight (if you were given a brace, keep it on),  lift the leg to 60 degrees, hold for 3 seconds, and slowly lower the leg.  Perform this exercise against resistance later as your leg gets stronger.  ?Leg Slides: Lying on your back, slowly slide your foot toward your buttocks, bending your knee up off the floor (only go as far as is comfortable). Then slowly slide your foot back down until your leg is flat on the floor again.  ?Angel Wings: Lying on your back spread your legs to the side as far apart as you can without causing discomfort.  ?Hamstring Strength:  Lying on your back, push your heel against the floor with your leg straight by tightening up the muscles of your buttocks.  Repeat, but this time bend your knee to a comfortable angle, and push your heel against the floor.  You may put a pillow under the heel to make it more comfortable if necessary.  ? ?A rehabilitation program following joint replacement surgery can speed recovery and prevent re-injury in the future due to weakened muscles. Contact your doctor or a physical therapist for more information on knee rehabilitation.  ? ? ?CONSTIPATION ? ?Constipation is defined medically as fewer than three stools per week and severe constipation as less than one stool per week.  Even if you have a regular bowel pattern at home, your normal regimen is likely to be disrupted due to multiple reasons following surgery.  Combination of anesthesia, postoperative narcotics, change in appetite and fluid intake all can affect your bowels.  ? ?YOU MUST use at least one of the following options; they are listed in order of increasing strength to get the job done.  They are all available over the counter, and you may need to use some, POSSIBLY even all of these options:   ? ?Drink plenty of fluids (prune juice may be helpful) and high fiber foods ?Colace 100 mg by mouth twice a day  ?Senokot for constipation as directed and as needed Dulcolax (bisacodyl), take with full glass of water  ?Miralax (polyethylene glycol) once  or twice a day as needed. ? ?If you have tried all these things and are unable to have a bowel movement in the first 3-4 days after surgery call either your surgeon or your primary doctor.   ? ?If you experience loose stools or diarrhea, hold the medications until you stool forms back up.  If your symptoms do not get better within 1 week or if they get worse, check with your doctor.  If you experience "the worst abdominal pain ever" or develop nausea or vomiting, please contact the office immediately for further recommendations for treatment. ? ? ?ITCHING:  If you experience itching with your medications, try taking only a single pain pill, or even half a pain pill at a time.  You can also use Benadryl over the counter for itching or also to help with sleep.  ? ?TED HOSE STOCKINGS:  Use stockings  on both legs until for at least 2 weeks or as directed by physician office. They may be removed at night for sleeping. ? ?MEDICATIONS:  See your medication summary on the ?After Visit Summary? that nursing will review with you.  You may have some home medications which will be placed on hold until you complete the course of blood thinner medication.  It is important for you to complete the blood thinner medication as prescribed. ? ?PRECAUTIONS:  If you experience chest pain or shortness of breath - call 911 immediately for transfer to the hospital emergency department.  ? ?If you develop a fever greater that 101 F, purulent drainage from wound, increased redness or drainage from wound, foul odor from the wound/dressing, or calf pain - CONTACT YOUR SURGEON.   ?                                                ?FOLLOW-UP APPOINTMENTS:  If you do not already have a post-op appointment, please call the office for an appointment to be seen by your surgeon.  Guidelines for how soon to be seen are listed in your ?After Visit Summary?, but are typically between 1-4 weeks after surgery. ? ?OTHER INSTRUCTIONS:  ? ?Knee Replacement:  Do  not place pillow under knee, focus on keeping the knee straight while resting. CPM instructions: 0-90 degrees, 2 hours in the morning, 2 hours in the afternoon, and 2 hours in the evening. Place foam bl

## 2021-10-09 NOTE — TOC Transition Note (Signed)
Transition of Care (TOC) - CM/SW Discharge Note ? ? ?Patient Details  ?Name: Nathan Hughes ?MRN: [id number] ?Date of Birth: 11/01/1965 ? ?[facility] CM/SW Contact:  ?Epifanio Lesches, RN ?Phone Number: ?10/09/2021, 10:23 AM ? ? ?Clinical Narrative:    ?Patient will DC to: Home ?Anticipated DC date: 10/09/2021 ?Family notified: yes ?Transport by: car ? ?         -  S/P Right TOTAL HIP ARTHROPLASTY , 10/09/2021 ? ?Per MD patient ready for DC today . RN, patient, and  patient's wife aware of DC.Pt declined home health services. DME needs noted , RW, BSC. Pt declined [facility]SC. Referral made with [facility], RW will be delivered to bedside prior to d/c. ?Pt without Rx med concerns. Post hospital f/u noted on AVS. ?Wife to provide transportation to home. ? ?RNCM will sign off for now as intervention is no longer needed. Please consult [place] again if new needs arise.  ? ? ?Final next level of care: Home/Self Care (Declined home health services) ?Barriers to Discharge: No Barriers Identified ? ? ?Patient Goals and CMS Choice ?  ?  ?Choice offered to / list presented to : Patient ? ?Discharge Placement ?  ?           ?  ?  ?  ?  ? ?Discharge Plan and Services ?  ?  ?           ?DME Arranged: Walker rolling ?DME Agency: [facility] ?Date DME Agency Contacted: 10/09/21 ?Time DME Agency Contacted: 1005 ?Representative spoke with at DME Agency: Selena Batten ?  ?  ?  ?  ?  ? ?Social Determinants of Health (SDOH) Interventions ?  ? ? ?Readmission Risk Interventions ?   ? View : No data to display.  ?  ?  ?  ? ? ? ? ? ?

## 2021-10-09 NOTE — Plan of Care (Signed)

## 2021-10-09 NOTE — Progress Notes (Signed)
Physical Therapy Treatment ?Patient Details ?Name: Nathan Hughes ?MRN: [id number] ?DOB: [birth year] ?Today's Date: 10/09/2021 ? ? ?History of Present Illness Pt is 56 yo male s/p R anterior THA 10/08/21.  Pt with hx of arthritis and inguinal hernia repair. ? ?  ?PT Comments  ? ? Patient progressing with ambulation and able to negotiate steps with wife assist appropriate for home entry.  Patient educated on HEP and issued but reports MD stated no abduction so removed from HEP.  Patient appropriate for home today with wife support.  RN in to give d/c instructions and RNCM obtaining equipment.    ?Recommendations for follow up therapy are one component of a multi-disciplinary discharge planning process, led by the attending physician.  Recommendations may be updated based on patient status, additional functional criteria and insurance authorization. ? ?Follow Up Recommendations ? Follow physician's recommendations for discharge plan and follow up therapies ?  ?  ?Assistance Recommended at Discharge PRN  ?Patient can return home with the following A little help with walking and/or transfers;A little help with bathing/dressing/bathroom;Assistance with cooking/housework;Help with stairs or ramp for entrance ?  ?Equipment Recommendations ? Rolling walker (2 wheels)  ?  ?Recommendations for Other Services   ? ? ?  ?Precautions / Restrictions Restrictions ?Weight Bearing Restrictions: No ?Other Position/Activity Restrictions: WBAT  ?  ? ?Mobility ? Bed Mobility ?Overal bed mobility: Needs Assistance ?  ?  ?  ?Supine to sit: Min guard ?Sit to supine: Supervision ?  ?General bed mobility comments: to sit assist for moving R leg initially, to supine educated how to use gait belt to assist leg into bed ?  ? ?Transfers ?Overall transfer level: Needs assistance ?Equipment used: Rolling walker (2 wheels) ?Transfers: Sit to/from Stand ?Sit to Stand: Supervision ?  ?  ?  ?  ?  ?General transfer comment: cues for technique, stood  several moments and cues to do heel raises due to mild dizziness ?  ? ?Ambulation/Gait ?  ?Gait Distance (Feet): 200 Feet ?Assistive device: Rolling walker (2 wheels) ?Gait Pattern/deviations: Step-through pattern, Antalgic, Decreased stance time - right, Decreased step length - left ?  ?  ?  ?General Gait Details: initially pausing walker for off loading R LE, then progressed to ambulating pushing walker and mild antalgia on R. ? ? ?Stairs ?Stairs: Yes ?Stairs assistance: Min assist ?Stair Management: One rail Left, Step to pattern, Forwards ?Number of Stairs: 6 ?General stair comments: completed steps with wife assisting after demonstration and cues for her positioning and technique ? ? ?Wheelchair Mobility ?  ? ?Modified Rankin (Stroke Patients Only) ?  ? ? ?  ?Balance Overall balance assessment: Needs assistance ?  ?Sitting balance-Leahy Scale: Normal ?  ?  ?  ?Standing balance-Leahy Scale: Fair ?Standing balance comment: RW to ambulate but could stand without AD ?  ?  ?  ?  ?  ?  ?  ?  ?  ?  ?  ?  ? ?  ?Cognition Arousal/Alertness: Awake/alert ?Behavior During Therapy: [facility] for tasks assessed/performed ?Overall Cognitive Status: Within Functional Limits for tasks assessed ?  ?  ?  ?  ?  ?  ?  ?  ?  ?  ?  ?  ?  ?  ?  ?  ?  ?  ?  ? ?  ?Exercises Total Joint Exercises ?Ankle Circles/Pumps: AROM, 10 reps, Both, Supine ?Quad Sets: AROM, Both, 5 reps, Supine ?Heel Slides: AROM, Right, 5 reps, Supine ?Hip ABduction/ADduction: AAROM, Right, 5  reps, Supine ? ?  ?General Comments General comments (skin integrity, edema, etc.): wife present throughout, educated on technique for car transfers using running board on truck ?  ?  ? ?Pertinent Vitals/Pain Pain Assessment ?Pain Score: 5  ?Pain Location: R hip ?Pain Descriptors / Indicators: Aching ?Pain Intervention(s): Monitored during session  ? ? ?Home Living   ?  ?  ?  ?  ?  ?  ?  ?  ?  ?   ?  ?Prior Function    ?  ?  ?   ? ?PT Goals (current goals can now be found in the  care plan section) Progress towards PT goals: Progressing toward goals ? ?  ?Frequency ? ? ? 7X/week ? ? ? ?  ?PT Plan Current plan remains appropriate  ? ? ?Co-evaluation   ?  ?  ?  ?  ? ?  ?AM-PAC PT "6 Clicks" Mobility   ?Outcome Measure ? Help needed turning from your back to your side while in a flat bed without using bedrails?: None ?Help needed moving from lying on your back to sitting on the side of a flat bed without using bedrails?: A Little ?Help needed moving to and from a bed to a chair (including a wheelchair)?: A Little ?Help needed standing up from a chair using your arms (e.g., wheelchair or bedside chair)?: A Little ?Help needed to walk in hospital room?: A Little ?Help needed climbing 3-5 steps with a railing? : A Little ?6 Click Score: 19 ? ?  ?End of Session Equipment Utilized During Treatment: Gait belt ?Activity Tolerance: Patient tolerated treatment well ?Patient left: in bed;with call bell/phone within reach;with family/visitor present ?  ?PT Visit Diagnosis: Other abnormalities of gait and mobility (R26.89);Muscle weakness (generalized) (M62.81) ?  ? ? ?Time: 0940-1010 ?PT Time Calculation (min) (ACUTE ONLY): 30 min ? ?Charges:  $Gait Training: 8-22 mins ?$Therapeutic Exercise: 8-22 mins          ?          ? ?Magda Kiel, PT ?Acute Rehabilitation Services ?[id number] ?Office:[phone] ?10/09/2021 ? ? ? ?Reginia Naas ?10/09/2021, 11:16 AM ? ?

## 2021-10-09 NOTE — Progress Notes (Signed)
AVS provided and reviewed with pt. All questions answered at this time.  ?

## 2021-10-22 ENCOUNTER — Ambulatory Visit (INDEPENDENT_AMBULATORY_CARE_PROVIDER_SITE_OTHER): Payer: 59 | Admitting: Orthopaedic Surgery

## 2021-10-22 ENCOUNTER — Encounter: Payer: Self-pay | Admitting: Orthopaedic Surgery

## 2021-10-22 DIAGNOSIS — Z96641 Presence of right artificial hip joint: Secondary | ICD-10-CM

## 2021-10-22 NOTE — Progress Notes (Signed)
The patient is 2 weeks status post a right total hip arthroplasty.  He is only 56 years old.  He reports good range of motion and strength of the right hip since surgery.  He is only taken Tylenol for pain and he has been compliant with a baby aspirin twice a day and wearing compressive hose.  He denies any significant foot and ankle swelling or calf pain. ? ?His calf is soft on the right side.  His foot and ankle not really swollen.  His incision looks good.  There is certainly a hematoma or seroma but it does not look like it needs to be aspirated.  The staples are removed and Steri-Strips applied.  His leg lengths are equal. ? ?He will stop his aspirin but continue his compressive hose for another week or 2.  He can get his incision wet in the shower but not submerge underwater.  He is already driving since he is only taken Tylenol.  All questions and concerns were answered and addressed.  We will see him back in 4 weeks just to see how he is doing from mobility standpoint but no x-rays are needed. ?

## 2021-11-05 NOTE — Progress Notes (Signed)
? ?Subjective:  ?Patient ID: Nathan Hughes, male    DOB: [birth year]  Age: 56 y.o. MRN: [id number] ? ?Chief Complaint  ?Patient presents with  ? Hypertension  ? Benign Prostatic Hypertrophy  ? Hyperlipidemia  ? ? ?HPI: chronic visit ?Patient presents for follow up of hypertension.  Patient tolerating Losartan 50 mg daily, aspirin 81 mg daily well without side effects. Patient is working on maintaining diet and exercise regimen and follows up as directed.  ? ?Patient presents with hyperlipidemia.  Compliance with treatment has been good; patient takes medicines as directed, maintains low cholesterol diet, follows up as directed, and maintains exercise regimen.  Patient is using Fish oil 1000 mg daily without problems.  ? 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 Last 2 weeks ago ?Current Outpatient Medications on File Prior to Visit  ?Medication Sig Dispense Refill  ? acetaminophen-codeine (TYLENOL #3) 300-30 MG tablet Take 1 tablet by mouth every 6 (six) hours as needed for moderate pain. 10 tablet 0  ? aspirin 81 MG chewable tablet Chew 1 tablet (81 mg total) by mouth 2 (two) times daily. 35 tablet 0  ? gabapentin (NEURONTIN) 300 MG capsule Take 1 capsule (300 mg total) by mouth at bedtime. 60 capsule 0  ? Glucosamine-Chondroit-Vit C-Mn (GLUCOSAMINE-CHONDROITIN) TABS Take 2 tablets by mouth daily.    ? losartan (COZAAR) 50 MG tablet Take 1 tablet by mouth once daily 90 tablet 2  ? methocarbamol (ROBAXIN) 500 MG tablet Take 1 tablet (500 mg total) by mouth every 6 (six) hours as needed for muscle spasms. 40 tablet 1  ? Multiple Vitamins-Minerals (MENS MULTIVITAMIN) CHEW Chew 2 tablets by mouth daily.    ? Omega-3 Fatty Acids (FISH OIL) 1000 MG CAPS Take 1,000 mg by mouth daily.    ? testosterone cypionate (DEPOTESTOSTERONE CYPIONATE) 200 MG/ML injection Inject 1 mL (200 mg total) into the muscle every 14 (fourteen) days. 10 mL 0  ? ?No current facility-administered medications on  file prior to visit.  ? ?Past Medical History:  ?Diagnosis Date  ? Arthritis   ? Benign essential hypertension 10/12/2019  ? 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 ?Past Surgical History:  ?Procedure Laterality Date  ? INGUINAL HERNIA REPAIR Right 1997  ? JOINT REPLACEMENT Right 10/08/2021  ? TOTAL HIP ARTHROPLASTY Right 10/08/2021  ? Procedure: Right TOTAL HIP ARTHROPLASTY ANTERIOR APPROACH;  Surgeon: Kathryne HitchBlackman, Christopher Y, MD;  [facility];  Service: Orthopedics;  Laterality: Right;  ? VASECTOMY    ?  ?Family History  ?Problem Relation Age of Onset  ? Stroke Father   ? Heart disease Father   ? Hypokalemia Sister   ? Kidney disease Sister   ? ?Social History  ? ?Socioeconomic History  ? Marital status: Married  ?  Spouse name: Not on file  ? Number of children: 2  ? Years of education: Not on file  ? Highest education level: Not on file  ?Occupational History  ? Occupation: Engineer, materialsecurity Officer  ?  Employer: [employer]  ? Occupation: Emergency planning/management officerolice officer  ?Tobacco Use  ? Smoking status: Never  ? Smokeless tobacco: Never  ?Vaping Use  ? Vaping Use: Never used  ?Substance and Sexual Activity  ? Alcohol use: Yes  ?  Alcohol/week: 4.0 standard drinks  ?  Types: 4 Cans of beer per week  ?  Comment: ocassionally  ? Drug use: Never  ? Sexual activity: Yes  ?  Partners: Female  ?Other Topics Concern  ? Not on  file  ?Social History Narrative  ? Not on file  ? ?Social Determinants of Health  ? ?Financial Resource Strain: Not on file  ?Food Insecurity: Not on file  ?Transportation Needs: Not on file  ?Physical Activity: Not on file  ?Stress: Not on file  ?Social Connections: Not on file  ? ? ?Review of Systems  ?Constitutional:  Negative for chills, fatigue, fever and unexpected weight change.  ?HENT:  Negative for congestion, ear pain, sinus pain and sore throat.   ?Eyes:  Negative for visual disturbance.  ?Respiratory:  Negative for cough and shortness of breath.   ?Cardiovascular:  Negative for chest pain and palpitations.   ?Gastrointestinal:  Negative for abdominal pain, blood in stool, constipation, diarrhea, nausea and vomiting.  ?Endocrine: Negative for polydipsia.  ?Genitourinary:  Negative for dysuria.  ?Musculoskeletal:  Negative for back pain.  ?Skin:  Negative for rash.  ?Neurological:  Negative for headaches.  ?Psychiatric/Behavioral: Negative.    ? ? ?Objective:  ?BP 120/80   Pulse 78   Temp 98.3 ?F (36.8 ?C)   Resp 15   Ht 5\' 9"  (1.753 m)   Wt 190 lb (86.2 kg)   BMI 28.06 kg/m?  ? ? ?  11/06/2021  ?  8:18 AM 10/09/2021  ?  7:33 AM 10/08/2021  ?  9:07 PM  ?BP/Weight  ?Systolic BP 120 107 136  ?Diastolic BP 80 74 78  ?Wt. (Lbs) 190    ?BMI 28.06 kg/m2    ? ? ?Physical Exam ?Vitals reviewed.  ?Constitutional:   ?   Appearance: Normal appearance.  ?HENT:  ?   Head: Normocephalic.  ?   Right Ear: Tympanic membrane normal.  ?   Left Ear: Tympanic membrane normal.  ?   Nose: Nose normal.  ?   Mouth/Throat:  ?   Mouth: Mucous membranes are moist.  ?   Pharynx: Oropharynx is clear.  ?Eyes:  ?   Extraocular Movements: Extraocular movements intact.  ?   Conjunctiva/sclera: Conjunctivae normal.  ?   Pupils: Pupils are equal, round, and reactive to light.  ?Cardiovascular:  ?   Rate and Rhythm: Normal rate and regular rhythm.  ?   Pulses: Normal pulses.  ?   Heart sounds: Normal heart sounds. No murmur heard. ?  No gallop.  ?Pulmonary:  ?   Effort: Pulmonary effort is normal. No respiratory distress.  ?   Breath sounds: Normal breath sounds. No wheezing.  ?Abdominal:  ?   General: Abdomen is flat. Bowel sounds are normal. There is no distension.  ?   Palpations: Abdomen is soft.  ?   Tenderness: There is no abdominal tenderness.  ?Musculoskeletal:     ?   General: Normal range of motion.  ?   Cervical back: Normal range of motion and neck supple.  ?   Right lower leg: No edema.  ?   Left lower leg: No edema.  ?Skin: ?   General: Skin is warm.  ?   Capillary Refill: Capillary refill takes less than 2 seconds.  ?Neurological:  ?    General: No focal deficit present.  ?   Mental Status: He is alert and oriented to person, place, and time.  ?   Gait: Gait normal.  ?   Deep Tendon Reflexes: Reflexes normal.  ?Psychiatric:     ?   Mood and Affect: Mood normal.     ?   Thought Content: Thought content normal.  ? ? ? ?  ? ?Lab Results  ?  Component Value Date  ? WBC 12.5 (H) 10/09/2021  ? HGB 10.9 (L) 10/09/2021  ? HCT 33.3 (L) 10/09/2021  ? PLT 228 10/09/2021  ? GLUCOSE 198 (H) 10/09/2021  ? CHOL 166 01/16/2021  ? TRIG 58 01/16/2021  ? HDL 73 01/16/2021  ? LDLCALC 81 01/16/2021  ? ALT 25 07/26/2021  ? AST 19 07/26/2021  ? NA 136 10/09/2021  ? K 4.5 10/09/2021  ? CL 104 10/09/2021  ? CREATININE 0.69 10/09/2021  ? BUN 11 10/09/2021  ? CO2 28 10/09/2021  ? INR 1.0 07/26/2021  ? HGBA1C 5.0 07/26/2021  ? ? ? ? ?Assessment & Plan:  ? ?Problem List Items Addressed This Visit   ? ?  ? Cardiovascular and Mediastinum  ? Benign essential hypertension - Primary  ? Relevant Orders  ? Comprehensive metabolic panel  ? CBC with Differential/Platelet ?An individual hypertension care plan was established and reinforced today.  The patient's status was assessed using clinical findings on exam and labs or diagnostic tests. The patient's success at meeting treatment goals on disease specific evidence-based guidelines and found to be well controlled. ?SELF MANAGEMENT: The patient and I together assessed ways to personally work towards obtaining the recommended goals. ?RECOMMENDATIONS: avoid decongestants found in common cold remedies, decrease consumption of alcohol, perform routine monitoring of BP with home BP cuff, exercise, reduction of dietary salt, take medicines as prescribed, try not to miss doses and quit smoking.  Regular exercise and maintaining a healthy weight is needed.  Stress reduction may help. ?A CLINICAL SUMMARY including written plan identify barriers to care unique to individual due to social or financial issues.  We attempt to mutually creat  solutions for individual and family understanding.   ?  ? Endocrine  ? Testicular hypofunction  ? Relevant Orders  ? Testosterone,Free and Total ?Patient has low T on supplements  ?  ? Other  ? Mixed hyperlipidemia  ?

## 2021-11-06 ENCOUNTER — Encounter: Payer: Self-pay | Admitting: Legal Medicine

## 2021-11-06 ENCOUNTER — Ambulatory Visit: Payer: 59 | Admitting: Legal Medicine

## 2021-11-06 VITALS — BP 120/80 | HR 78 | Temp 98.3°F | Resp 15 | Ht 69.0 in | Wt 190.0 lb

## 2021-11-06 DIAGNOSIS — N400 Enlarged prostate without lower urinary tract symptoms: Secondary | ICD-10-CM

## 2021-11-06 DIAGNOSIS — E782 Mixed hyperlipidemia: Secondary | ICD-10-CM

## 2021-11-06 DIAGNOSIS — E291 Testicular hypofunction: Secondary | ICD-10-CM | POA: Diagnosis not present

## 2021-11-06 DIAGNOSIS — I10 Essential (primary) hypertension: Secondary | ICD-10-CM | POA: Diagnosis not present

## 2021-11-07 LAB — COMPREHENSIVE METABOLIC PANEL
ALT: 16 IU/L (ref 0–44)
AST: 19 IU/L (ref 0–40)
Albumin/Globulin Ratio: 1.5 (ref 1.2–2.2)
Albumin: 4.5 g/dL (ref 3.8–4.9)
Alkaline Phosphatase: 81 IU/L (ref 44–121)
BUN/Creatinine Ratio: 20 (ref 9–20)
BUN: 16 mg/dL (ref 6–24)
Bilirubin Total: 1.4 mg/dL — ABNORMAL HIGH (ref 0.0–1.2)
CO2: 27 mmol/L (ref 20–29)
Calcium: 9.7 mg/dL (ref 8.7–10.2)
Chloride: 100 mmol/L (ref 96–106)
Creatinine, Ser: 0.8 mg/dL (ref 0.76–1.27)
Globulin, Total: 3.1 g/dL (ref 1.5–4.5)
Glucose: 96 mg/dL (ref 70–99)
Potassium: 4.8 mmol/L (ref 3.5–5.2)
Sodium: 137 mmol/L (ref 134–144)
Total Protein: 7.6 g/dL (ref 6.0–8.5)
eGFR: 104 mL/min/{1.73_m2} (ref >59)

## 2021-11-07 LAB — CBC WITH DIFFERENTIAL/PLATELET
Basophils Absolute: 0.1 10*3/uL (ref 0.0–0.2)
Basos: 2 %
EOS (ABSOLUTE): 0.2 10*3/uL (ref 0.0–0.4)
Eos: 4 %
Hematocrit: 41.5 % (ref 37.5–51.0)
Hemoglobin: 13.8 g/dL (ref 13.0–17.7)
Immature Grans (Abs): 0 10*3/uL (ref 0.0–0.1)
Immature Granulocytes: 0 %
Lymphocytes Absolute: 1.1 10*3/uL (ref 0.7–3.1)
Lymphs: 28 %
MCH: 31.9 pg (ref 26.6–33.0)
MCHC: 33.3 g/dL (ref 31.5–35.7)
MCV: 96 fL (ref 79–97)
Monocytes Absolute: 0.6 10*3/uL (ref 0.1–0.9)
Monocytes: 15 %
Neutrophils Absolute: 2.1 10*3/uL (ref 1.4–7.0)
Neutrophils: 51 %
Platelets: 254 10*3/uL (ref 150–450)
RBC: 4.33 x10E6/uL (ref 4.14–5.80)
RDW: 11.9 % (ref 11.6–15.4)
WBC: 4.1 10*3/uL (ref 3.4–10.8)

## 2021-11-07 LAB — TESTOSTERONE,FREE AND TOTAL
Testosterone, Free: 3.3 pg/mL — ABNORMAL LOW (ref 7.2–24.0)
Testosterone: 176 ng/dL — ABNORMAL LOW (ref 264–916)

## 2021-11-07 LAB — PSA: Prostate Specific Ag, Serum: 6.1 ng/mL — ABNORMAL HIGH (ref 0.0–4.0)

## 2021-11-07 LAB — LIPID PANEL
Chol/HDL Ratio: 2 ratio (ref 0.0–5.0)
Cholesterol, Total: 177 mg/dL (ref 100–199)
HDL: 88 mg/dL (ref >39)
LDL Chol Calc (NIH): 81 mg/dL (ref 0–99)
Triglycerides: 37 mg/dL (ref 0–149)
VLDL Cholesterol Cal: 8 mg/dL (ref 5–40)

## 2021-11-07 LAB — CARDIOVASCULAR RISK ASSESSMENT

## 2021-11-10 NOTE — Progress Notes (Signed)
Kidney aND LIVER TESTS normal, PSA 6.1 was seeing dr. Saddie Benders, testosterone 176 low, free testosterone, 3.3, continue on present dose, cholesterol normal, CBC all normal ?lp

## 2021-11-19 ENCOUNTER — Other Ambulatory Visit: Payer: Self-pay

## 2021-11-19 ENCOUNTER — Encounter: Payer: Self-pay | Admitting: Orthopaedic Surgery

## 2021-11-19 ENCOUNTER — Ambulatory Visit (INDEPENDENT_AMBULATORY_CARE_PROVIDER_SITE_OTHER): Payer: 59 | Admitting: Orthopaedic Surgery

## 2021-11-19 DIAGNOSIS — M25462 Effusion, left knee: Secondary | ICD-10-CM

## 2021-11-19 DIAGNOSIS — Z96641 Presence of right artificial hip joint: Secondary | ICD-10-CM | POA: Diagnosis not present

## 2021-11-19 MED ORDER — LIDOCAINE HCL 1 % IJ SOLN
3.0000 mL | INTRAMUSCULAR | Status: AC | PRN
  Administered 2021-11-19: 3 mL

## 2021-11-19 MED ORDER — METHYLPREDNISOLONE ACETATE 40 MG/ML IJ SUSP
40.0000 mg | INTRAMUSCULAR | Status: AC | PRN
  Administered 2021-11-19: 40 mg via INTRA_ARTICULAR

## 2021-11-19 NOTE — Progress Notes (Signed)
Office Visit Note   Patient: Nathan Hughes           Date of Birth: [birth year]           MRN: [id number] Visit Date: 11/19/2021              Requested by: Abigail Miyamoto, MD [address] [place],  [place] [address] PCP: Abigail Miyamoto, MD   Assessment & Plan: Visit Diagnoses:  1. Status post total replacement of right hip   2. Effusion, left knee     Plan: I was able to aspirate 20 to 25 cc of clear yellow fluid from his left knee and then place a steroid injection in his left knee.  This is now his second steroid injection.  Given his continued recurrent effusions and pain with that knee combined with the failure of conservative treatment for over 6 months, a MRI of the left knee is warranted to rule out a meniscal tear.  He is already taking anti-inflammatories and work on activity modification and he has very strong quads and continues quad strengthening exercises.  We will see him back in 2 weeks and hopefully go over an MRI of his left knee.  Follow-Up Instructions: Return in about 2 weeks (around 12/03/2021).   Orders:  Orders Placed This Encounter  Procedures   Large Joint Inj   No orders of the defined types were placed in this encounter.     Procedures: Large Joint Inj: L knee on 11/19/2021 8:21 AM Indications: diagnostic evaluation and pain Details: 22 G 1.5 in needle, superolateral approach  Arthrogram: No  Medications: 3 mL lidocaine 1 %; 40 mg methylPREDNISolone acetate 40 MG/ML Outcome: tolerated well, no immediate complications Procedure, treatment alternatives, risks and benefits explained, specific risks discussed. Consent was given by the patient. Immediately prior to procedure a time out was called to verify the correct patient, procedure, equipment, support staff and site/side marked as required. Patient was prepped and draped in the usual sterile fashion.      Clinical Data: No additional  findings.   Subjective: Chief Complaint  Patient presents with   Right Hip - Routine Post Op  The patient comes in today at 6 weeks status post a right total hip arthroplasty.  He said the right hip is doing great but he has been having left knee pain and swelling since November or December of this past year.  He had a steroid injection then in that knee done elsewhere and I did not really help.  I do have x-rays that I reviewed on the canopy system of his left knee showing only patellofemoral slight changes and well-maintained medial and lateral compartments.  He is only 56 years old and very active.  He says his right hip replacement is doing great.  HPI  Review of Systems   Objective: Vital Signs: There were no vitals taken for this visit.  Physical Exam He is alert and orient x3 and in no acute distress Ortho Exam Examination of his right operative hip shows a move smoothly and fluidly.  The left knee does have a mild effusion.  There is patellofemoral crepitation and slight varus malalignment.  There is slight medial joint line tenderness.  There is a palpable plica as well. Specialty Comments:  No specialty comments available.  Imaging: No results found. X-rays of the left knee done 6 months ago on the canopy system shows slight varus malalignment of the knee but medial lateral  compartments are well-maintained.  There is patellofemoral arthritic changes.  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    Family History  Problem Relation Age of Onset   Stroke Father    Heart disease Father    Hypokalemia Sister    Kidney disease Sister     Past  Surgical History:  Procedure Laterality Date   INGUINAL HERNIA REPAIR Right 1997   JOINT REPLACEMENT Right 10/08/2021   TOTAL HIP ARTHROPLASTY Right 10/08/2021   Procedure: Right TOTAL HIP ARTHROPLASTY ANTERIOR APPROACH;  Surgeon: Kathryne Hitch, MD;  Location: [facility] OR;  Service: Orthopedics;  Laterality: Right;   VASECTOMY     Social History   Occupational History   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[employer]   Occupation: Emergency planning/management officer  Tobacco Use   Smoking status: Never   Smokeless tobacco: Never  Vaping Use   Vaping Use: Never used  Substance and Sexual Activity   Alcohol use: Yes    Alcohol/week: 4.0 standard drinks    Types: 4 Cans of beer per week    Comment: ocassionally   Drug use: Never   Sexual activity: Yes    Partners: Female

## 2021-12-09 ENCOUNTER — Ambulatory Visit: Payer: 59 | Admitting: Orthopaedic Surgery

## 2021-12-10 DIAGNOSIS — S83242A Other tear of medial meniscus, current injury, left knee, initial encounter: Secondary | ICD-10-CM | POA: Diagnosis not present

## 2021-12-10 DIAGNOSIS — M25462 Effusion, left knee: Secondary | ICD-10-CM | POA: Diagnosis not present

## 2021-12-10 DIAGNOSIS — M7989 Other specified soft tissue disorders: Secondary | ICD-10-CM | POA: Diagnosis not present

## 2021-12-12 ENCOUNTER — Ambulatory Visit: Payer: 59 | Admitting: Orthopaedic Surgery

## 2021-12-18 ENCOUNTER — Encounter: Payer: Self-pay | Admitting: Orthopaedic Surgery

## 2021-12-18 ENCOUNTER — Ambulatory Visit: Payer: 59 | Admitting: Orthopaedic Surgery

## 2021-12-18 ENCOUNTER — Other Ambulatory Visit (HOSPITAL_COMMUNITY): Payer: Self-pay

## 2021-12-18 ENCOUNTER — Other Ambulatory Visit: Payer: Self-pay | Admitting: Legal Medicine

## 2021-12-18 DIAGNOSIS — E291 Testicular hypofunction: Secondary | ICD-10-CM

## 2021-12-18 DIAGNOSIS — M1712 Unilateral primary osteoarthritis, left knee: Secondary | ICD-10-CM

## 2021-12-18 DIAGNOSIS — M25462 Effusion, left knee: Secondary | ICD-10-CM

## 2021-12-18 DIAGNOSIS — Z96641 Presence of right artificial hip joint: Secondary | ICD-10-CM

## 2021-12-18 MED ORDER — TESTOSTERONE CYPIONATE 200 MG/ML IM SOLN
200.0000 mg | INTRAMUSCULAR | 0 refills | Status: DC
  Filled 2021-12-18: qty 6, 84d supply, fill #0
  Filled 2022-04-24: qty 4, 56d supply, fill #1
  Filled 2022-04-24 – 2022-04-28 (×2): qty 2, 28d supply, fill #1
  Filled 2022-06-04: qty 2, 28d supply, fill #2

## 2021-12-18 NOTE — Progress Notes (Signed)
The patient comes in today to go over an MRI of his left knee.  He is 56 years old and we replaced his right hip just a few months ago.  He is very active in a thin individual.  He has been having a recurrent effusion for some time with his left knee and we have aspirated it before at least twice with a large joint effusion.  At this point it necessitated an MRI to assess the cartilage in the meniscus.  He has a moderate effusion again with his left knee today.  His pain is only along the medial joint line.  He has some varus malalignment which is easy correctable.  His extension is full and his Lachman's is negative.  His flexion is also full.  The MRI of his left knee does show a horizontal tear of the medial meniscus from the posterior horn to mid body.  There is also areas of full-thickness cartilage loss in the medial compartment of his knee as well as subchondral edema.  His ACL and PCL are intact and the patellofemoral joint and the lateral compartment showed minimal arthritic changes.  At this point I would like to send him to one of my colleagues in town Dr. Teryl Lucy with [name] to assess his left knee to consider whether or not he is a candidate for a partial medial compartment unicompartmental knee replacement of the left knee.  From my standpoint, I will see him back in 6 months with a standing low AP pelvis and lateral of his right operative hip.  He agrees with this referral and treatment plan.

## 2021-12-19 ENCOUNTER — Other Ambulatory Visit: Payer: Self-pay

## 2021-12-19 DIAGNOSIS — M1712 Unilateral primary osteoarthritis, left knee: Secondary | ICD-10-CM

## 2021-12-24 ENCOUNTER — Other Ambulatory Visit (HOSPITAL_COMMUNITY): Payer: Self-pay

## 2021-12-25 DIAGNOSIS — M1712 Unilateral primary osteoarthritis, left knee: Secondary | ICD-10-CM | POA: Diagnosis not present

## 2022-01-14 ENCOUNTER — Encounter: Payer: Self-pay | Admitting: Legal Medicine

## 2022-01-14 ENCOUNTER — Ambulatory Visit: Payer: 59 | Admitting: Legal Medicine

## 2022-01-14 VITALS — BP 112/80 | HR 81 | Temp 97.0°F | Ht 69.0 in | Wt 199.6 lb

## 2022-01-14 DIAGNOSIS — E782 Mixed hyperlipidemia: Secondary | ICD-10-CM

## 2022-01-14 DIAGNOSIS — Z01818 Encounter for other preprocedural examination: Secondary | ICD-10-CM | POA: Insufficient documentation

## 2022-01-14 DIAGNOSIS — Z96641 Presence of right artificial hip joint: Secondary | ICD-10-CM

## 2022-01-14 DIAGNOSIS — I10 Essential (primary) hypertension: Secondary | ICD-10-CM

## 2022-01-14 DIAGNOSIS — M1712 Unilateral primary osteoarthritis, left knee: Secondary | ICD-10-CM | POA: Diagnosis not present

## 2022-01-14 DIAGNOSIS — Z6829 Body mass index (BMI) 29.0-29.9, adult: Secondary | ICD-10-CM | POA: Diagnosis not present

## 2022-01-14 NOTE — Progress Notes (Signed)
Subjective:  Patient ID: Nathan Hughes, male    DOB: 1965-11-28  Age: 56 y.o. MRN: [id number]  Chief Complaint  Patient presents with   surgery clearance    HPI: preoperative assessment for partial right knee replacement medial  Left knee pain since  November had pain.  [name]  Patient presents for follow up of hypertension.  Patient tolerating losartan well with side effects.  Patient was diagnosed with hypertension 20215 so has been treated for hypertension for 8 years.Patient is working on maintaining diet and exercise regimen and follows up as directed. Complication include none.   Patient presents with hyperlipidemia.  Compliance with treatment has been good; patient takes medicines as directed, maintains low cholesterol diet, follows up as directed, and maintains exercise regimen.  Patient is using diet without problems.   Hypogonadism on testosterone supplements     Current Outpatient Medications on File Prior to Visit  Medication Sig Dispense Refill   aspirin 81 MG chewable tablet Chew 1 tablet (81 mg total) by mouth 2 (two) times daily. 35 tablet 0   Glucosamine-Chondroit-Vit C-Mn (GLUCOSAMINE-CHONDROITIN) TABS Take 2 tablets by mouth daily.     losartan (COZAAR) 50 MG tablet Take 1 tablet by mouth once daily 90 tablet 2   Multiple Vitamins-Minerals (MENS MULTIVITAMIN) CHEW Chew 2 tablets by mouth daily.     Omega-3 Fatty Acids (FISH OIL) 1000 MG CAPS Take 1,000 mg by mouth daily.     testosterone cypionate (DEPOTESTOSTERONE CYPIONATE) 200 MG/ML injection Inject 1 mL (200 mg total) into the muscle every 14 (fourteen) days. 10 mL 0   No current facility-administered medications on file prior to visit.   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   Past Surgical History:  Procedure Laterality Date   INGUINAL HERNIA REPAIR Right 1997   JOINT REPLACEMENT Right 10/08/2021    TOTAL HIP ARTHROPLASTY Right 10/08/2021   Procedure: Right TOTAL HIP ARTHROPLASTY ANTERIOR APPROACH;  Surgeon: Kathryne Hitch, MD;  Location: [facility] OR;  Service: Orthopedics;  Laterality: Right;   VASECTOMY      Family History  Problem Relation Age of Onset   Stroke Father    Heart disease Father    Hypokalemia Sister    Kidney disease Sister    Social History   Socioeconomic History   Marital status: Married    Spouse name: Not on file   Number of children: 2   Years of education: Not on file   Highest education level: Not on file  Occupational History   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[employer]   Occupation: Emergency planning/management officer  Tobacco Use   Smoking status: Never   Smokeless tobacco: Never  Vaping Use   Vaping Use: Never used  Substance and Sexual Activity   Alcohol use: Yes    Alcohol/week: 4.0 standard drinks of alcohol    Types: 4 Cans of beer per week    Comment: ocassionally   Drug use: Never   Sexual activity: Yes    Partners: Female  Other Topics Concern   Not on file  Social History Narrative   Not on file   Social Determinants of Health   Financial Resource Strain: Not on file  Food Insecurity: Not on file  Transportation Needs: Not on file  Physical Activity: Not on file  Stress: Not on file  Social Connections: Not on file    Review  of Systems  Constitutional:  Negative for fatigue.  HENT:  Negative for congestion, ear pain and sore throat.   Eyes:  Negative for visual disturbance.  Respiratory:  Negative for cough and shortness of breath.   Cardiovascular:  Negative for chest pain.  Gastrointestinal:  Negative for abdominal pain, constipation, diarrhea, nausea and vomiting.  Genitourinary:  Negative for dysuria, frequency and urgency.  Musculoskeletal:  Negative for back pain and myalgias. Arthralgias: left knee. Neurological:  Negative for dizziness and headaches.  Psychiatric/Behavioral:  Negative for agitation and  sleep disturbance. The patient is not nervous/anxious.      Objective:  BP 112/80 (BP Location: Left Arm, Patient Position: Sitting, Cuff Size: Large)   Pulse 81   Temp (!) 97 F (36.1 C) (Temporal)   Ht 5\' 9"  (1.753 m)   Wt 199 lb 9.6 oz (90.5 kg)   SpO2 96%   BMI 29.48 kg/m      01/14/2022    7:52 AM 11/06/2021    8:18 AM 10/09/2021    7:33 AM  BP/Weight  Systolic BP 112 120 107  Diastolic BP 80 80 74  Wt. (Lbs) 199.6 190   BMI 29.48 kg/m2 28.06 kg/m2     Physical Exam Vitals reviewed.  Constitutional:      General: He is not in acute distress.    Appearance: Normal appearance.  HENT:     Right Ear: Tympanic membrane normal.     Left Ear: Tympanic membrane normal.     Nose: Nose normal.     Mouth/Throat:     Mouth: Mucous membranes are moist.     Pharynx: Oropharynx is clear.  Eyes:     Extraocular Movements: Extraocular movements intact.     Conjunctiva/sclera: Conjunctivae normal.     Pupils: Pupils are equal, round, and reactive to light.  Cardiovascular:     Rate and Rhythm: Normal rate and regular rhythm.     Pulses: Normal pulses.     Heart sounds: Normal heart sounds. No murmur heard.    No gallop.  Pulmonary:     Effort: Pulmonary effort is normal. No respiratory distress.     Breath sounds: Normal breath sounds. No wheezing.  Abdominal:     General: Abdomen is flat. Bowel sounds are normal. There is no distension.     Palpations: Abdomen is soft.     Tenderness: There is no abdominal tenderness.  Musculoskeletal:        General: Tenderness (left knee medial compartment crepitus) present. Normal range of motion.     Cervical back: Normal range of motion.     Right lower leg: No edema.     Left lower leg: No edema.  Skin:    General: Skin is warm.     Capillary Refill: Capillary refill takes less than 2 seconds.  Neurological:     General: No focal deficit present.     Mental Status: He is alert and oriented to person, place, and time.     Gait:  Gait normal.     Deep Tendon Reflexes: Reflexes normal.  Psychiatric:        Mood and Affect: Mood normal.        Behavior: Behavior normal.         Lab Results  Component Value Date   WBC 4.1 11/06/2021   HGB 13.8 11/06/2021   HCT 41.5 11/06/2021   PLT 254 11/06/2021   GLUCOSE 96 11/06/2021   CHOL 177 11/06/2021   TRIG 37  11/06/2021   HDL 88 11/06/2021   LDLCALC 81 11/06/2021   ALT 16 11/06/2021   AST 19 11/06/2021   NA 137 11/06/2021   K 4.8 11/06/2021   CL 100 11/06/2021   CREATININE 0.80 11/06/2021   BUN 16 11/06/2021   CO2 27 11/06/2021   INR 1.0 07/26/2021   HGBA1C 5.0 07/26/2021      Assessment & Plan:   Problem List Items Addressed This Visit       Cardiovascular and Mediastinum  Encounter for preoperative assessment Patient was examined for preoperative assessment prior to his partial left knee replacement.  He is healthy and cleared for surgery    Benign essential hypertension - Primary An individual hypertension care plan was established and reinforced today.  The patient's status was assessed using clinical findings on exam and labs or diagnostic tests. The patient's success at meeting treatment goals on disease specific evidence-based guidelines and found to be well controlled. SELF MANAGEMENT: The patient and I together assessed ways to personally work towards obtaining the recommended goals. RECOMMENDATIONS: avoid decongestants found in common cold remedies, decrease consumption of alcohol, perform routine monitoring of BP with home BP cuff, exercise, reduction of dietary salt, take medicines as prescribed, try not to miss doses and quit smoking.  Regular exercise and maintaining a healthy weight is needed.  Stress reduction may help. A CLINICAL SUMMARY including written plan identify barriers to care unique to individual due to social or financial issues.  We attempt to mutually creat solutions for individual and family understanding.       Musculoskeletal and Integument   Osteoarthritis of left knee Patient has osteoarthritis of left knee especially in the medial compartment and we will get a partial knee replacement     Other   Status post total replacement of right hip Patient has had a total hip replacement on the right side with good results.    Mixed hyperlipidemia AN INDIVIDUAL CARE PLAN for hyperlipidemia/ cholesterol was established and reinforced today.  The patient's status was assessed using clinical findings on exam, lab and other diagnostic tests. The patient's disease status was assessed based on evidence-based guidelines and found to be fair controlled. MEDICATIONS were reviewed. SELF MANAGEMENT GOALS have been discussed and patient's success at attaining the goal of low cholesterol was assessed. RECOMMENDATION given include regular exercise 3 days a week and low cholesterol/low fat diet. CLINICAL SUMMARY including written plan to identify barriers unique to the patient due to social or economic  reasons was discussed.    BMI 29.0-29.9,adult We discussed exercise and diet     .       Follow-up: Return if symptoms worsen or fail to improve.  An After Visit Summary was printed and given to the patient.  Brent Bulla, MD [facility] [phone]

## 2022-01-28 ENCOUNTER — Other Ambulatory Visit: Payer: Self-pay | Admitting: Legal Medicine

## 2022-01-28 DIAGNOSIS — I10 Essential (primary) hypertension: Secondary | ICD-10-CM

## 2022-04-07 DIAGNOSIS — Z760 Encounter for issue of repeat prescription: Secondary | ICD-10-CM | POA: Diagnosis not present

## 2022-04-24 ENCOUNTER — Other Ambulatory Visit (HOSPITAL_COMMUNITY): Payer: Self-pay

## 2022-04-24 ENCOUNTER — Other Ambulatory Visit: Payer: Self-pay

## 2022-04-24 ENCOUNTER — Other Ambulatory Visit (HOSPITAL_COMMUNITY): Payer: 59

## 2022-04-28 ENCOUNTER — Other Ambulatory Visit (HOSPITAL_COMMUNITY): Payer: Self-pay

## 2022-04-28 IMAGING — DX DG PORTABLE PELVIS
1 series · 1 of 1 positions shown · non-contrast
Comparison: None.

CLINICAL DATA: Postoperative

EXAM:
PORTABLE PELVIS 1-2 VIEWS

[pelvis ap]
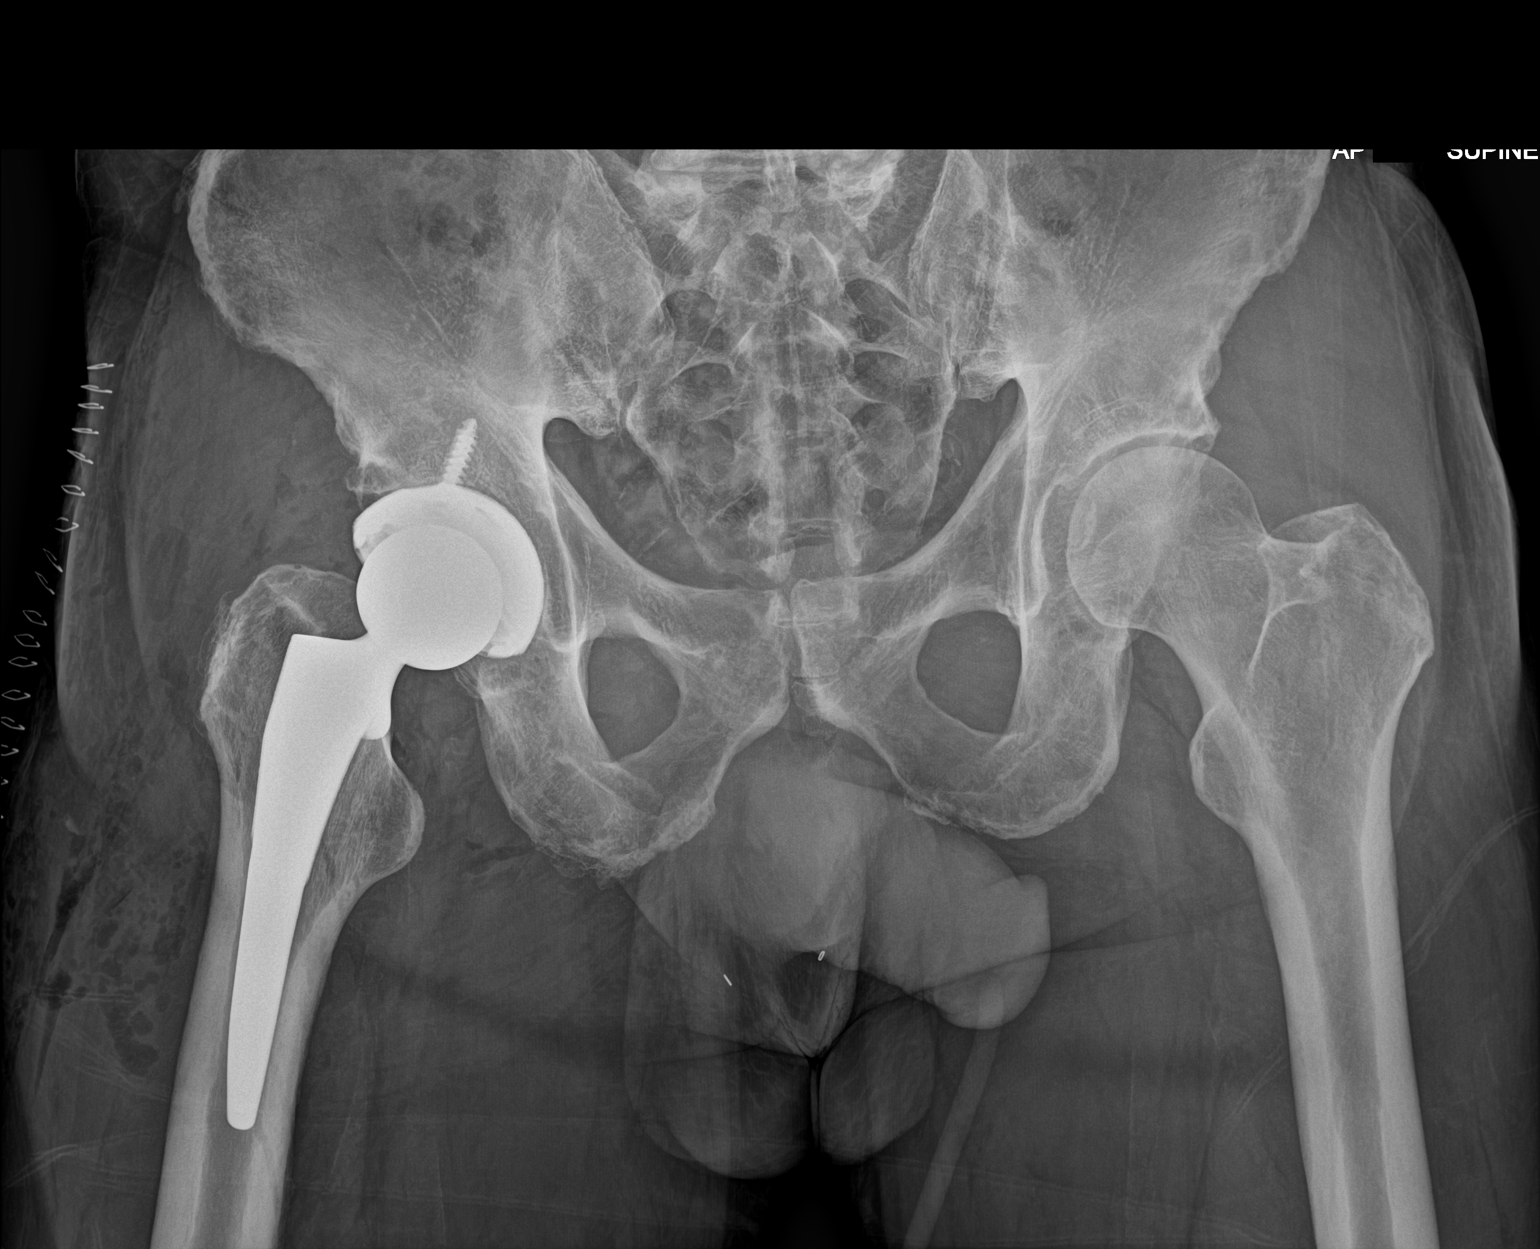

[1 of 1 positions shown; findings below may reference images not displayed]

FINDINGS: Status post right hip total arthroplasty with expected overlying
postoperative change. No perihardware fracture or component
malpositioning.
IMPRESSION: Status post right hip total arthroplasty with expected overlying
postoperative change. No perihardware fracture or component
malpositioning.

## 2022-05-06 ENCOUNTER — Ambulatory Visit: Admit: 2022-05-06 | Payer: 59 | Admitting: Orthopedic Surgery

## 2022-05-06 SURGERY — ARTHROPLASTY, KNEE, UNICOMPARTMENTAL
Anesthesia: Choice | Site: Knee | Laterality: Left

## 2022-05-13 ENCOUNTER — Encounter: Payer: Self-pay | Admitting: Legal Medicine

## 2022-05-13 NOTE — Progress Notes (Unsigned)
Subjective:  Patient ID: Nathan Hughes, male    DOB: 1966-03-12  Age: 56 y.o. MRN: [id number]  Chief Complaint  Patient presents with   Hypertension   Hyperlipidemia    HPI: chronic  Patient presents for follow up of hypertension.  Patient tolerating Losartan 50 mg daily, aspirin 81 mg daily well without side effects. Patient is working on maintaining diet and exercise regimen and follows up as directed.   Patient presents with hyperlipidemia.  Compliance with treatment has been good; patient takes medicines as directed, maintains low cholesterol diet, follows up as directed, and maintains exercise regimen.  Patient is using Fish oil 1000 mg daily without problems.   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 Last 2 weeks ago  Current Outpatient Medications on File Prior to Visit  Medication Sig Dispense Refill   aspirin 81 MG chewable tablet Chew 1 tablet (81 mg total) by mouth 2 (two) times daily. 35 tablet 0   Glucosamine-Chondroit-Vit C-Mn (GLUCOSAMINE-CHONDROITIN) TABS Take 2 tablets by mouth daily.     losartan (COZAAR) 50 MG tablet Take 1 tablet by mouth once daily 90 tablet 2   Multiple Vitamins-Minerals (MENS MULTIVITAMIN) CHEW Chew 2 tablets by mouth daily.     Omega-3 Fatty Acids (FISH OIL) 1000 MG CAPS Take 1,000 mg by mouth daily.     testosterone cypionate (DEPOTESTOSTERONE CYPIONATE) 200 MG/ML injection Inject 1 mL (200 mg total) into the muscle every 14 (fourteen) days. 10 mL 0   No current facility-administered medications on file prior to visit.   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   Past Surgical History:  Procedure Laterality Date   INGUINAL HERNIA REPAIR Right 1997   JOINT REPLACEMENT Right 10/08/2021   TOTAL HIP ARTHROPLASTY Right 10/08/2021   Procedure: Right TOTAL HIP  ARTHROPLASTY ANTERIOR APPROACH;  Surgeon: Kathryne Hitch, MD;  Location: [facility] OR;  Service: Orthopedics;  Laterality: Right;   VASECTOMY      Family History  Problem Relation Age of Onset   Stroke Father    Heart disease Father    Hypokalemia Sister    Kidney disease Sister    Social History   Socioeconomic History   Marital status: Married    Spouse name: Not on file   Number of children: 2   Years of education: Not on file   Highest education level: Not on file  Occupational History   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[employer]   Occupation: Emergency planning/management officer  Tobacco Use   Smoking status: Never   Smokeless tobacco: Never  Vaping Use   Vaping Use: Never used  Substance and Sexual Activity   Alcohol use: Yes    Alcohol/week: 4.0 standard drinks of alcohol    Types: 4 Cans of beer per week    Comment: ocassionally   Drug use: Never   Sexual activity: Yes    Partners: Female  Other Topics Concern   Not on file  Social History Narrative   Not on file   Social Determinants of Health   Financial Resource Strain: Not on file  Food Insecurity: Not on file  Transportation Needs: Not on file  Physical Activity: Not on file  Stress: Not on file  Social Connections: Not on file    Review of Systems  Constitutional:  Negative for  chills, fatigue and fever.  HENT:  Negative for congestion, ear pain and sore throat.   Respiratory:  Negative for cough and shortness of breath.   Cardiovascular:  Negative for chest pain and palpitations.  Gastrointestinal:  Negative for abdominal pain, constipation, diarrhea, nausea and vomiting.  Endocrine: Negative for polydipsia, polyphagia and polyuria.  Genitourinary:  Negative for dysuria and frequency.  Musculoskeletal:  Negative for arthralgias and back pain.  Neurological:  Negative for dizziness and headaches.  Psychiatric/Behavioral:  Negative for dysphoric mood. The patient is not nervous/anxious.       Objective:  BP 132/72   Pulse 70   Temp (!) 97.4 F (36.3 C)   Ht 5\' 9"  (1.753 m)   Wt 195 lb (88.5 kg)   SpO2 98%   BMI 28.80 kg/m      05/14/2022    7:33 AM 01/14/2022    7:52 AM 11/06/2021    8:18 AM  BP/Weight  Systolic BP [id number]  Diastolic BP 72 80 80  Wt. (Lbs) 195 199.6 190  BMI 28.8 kg/m2 29.48 kg/m2 28.06 kg/m2    Physical Exam Vitals reviewed.  Constitutional:      Appearance: Normal appearance.  HENT:     Head: Normocephalic.     Right Ear: Tympanic membrane normal.     Left Ear: Tympanic membrane normal.     Nose: Nose normal.     Mouth/Throat:     Mouth: Mucous membranes are moist.     Pharynx: Oropharynx is clear.  Eyes:     Extraocular Movements: Extraocular movements intact.     Conjunctiva/sclera: Conjunctivae normal.     Pupils: Pupils are equal, round, and reactive to light.  Cardiovascular:     Rate and Rhythm: Normal rate and regular rhythm.     Pulses: Normal pulses.     Heart sounds: Normal heart sounds. No murmur heard.    No gallop.  Pulmonary:     Effort: Pulmonary effort is normal. No respiratory distress.     Breath sounds: Normal breath sounds. No wheezing.  Abdominal:     General: Abdomen is flat. Bowel sounds are normal. There is no distension.     Palpations: Abdomen is soft.     Tenderness: There is no abdominal tenderness.  Musculoskeletal:        General: Normal range of motion.     Cervical back: Normal range of motion and neck supple.     Right lower leg: No edema.     Left lower leg: No edema.  Skin:    General: Skin is warm.     Capillary Refill: Capillary refill takes less than 2 seconds.     Comments: kEratosis scalp  Neurological:     General: No focal deficit present.     Mental Status: He is alert and oriented to person, place, and time. Mental status is at baseline.     Gait: Gait normal.  Psychiatric:        Mood and Affect: Mood normal.        Thought Content: Thought content normal.         Judgment: Judgment normal.         Lab Results  Component Value Date   WBC 4.1 11/06/2021   HGB 13.8 11/06/2021   HCT 41.5 11/06/2021   PLT 254 11/06/2021   GLUCOSE 96 11/06/2021   CHOL 177 11/06/2021   TRIG 37 11/06/2021   HDL 88 11/06/2021   Henriette 81 11/06/2021  ALT 16 11/06/2021   AST 19 11/06/2021   NA 137 11/06/2021   K 4.8 11/06/2021   CL 100 11/06/2021   CREATININE 0.80 11/06/2021   BUN 16 11/06/2021   CO2 27 11/06/2021   INR 1.0 07/26/2021   HGBA1C 5.0 07/26/2021      Assessment & Plan:   Problem List Items Addressed This Visit       Cardiovascular and Mediastinum   Benign essential hypertension   Relevant Orders   Comprehensive metabolic panel   CBC with Differential/Platelet An individual hypertension care plan was established and reinforced today.  The patient's status was assessed using clinical findings on exam and labs or diagnostic tests. The patient's success at meeting treatment goals on disease specific evidence-based guidelines and found to be well controlled. SELF MANAGEMENT: The patient and I together assessed ways to personally work towards obtaining the recommended goals. RECOMMENDATIONS: avoid decongestants found in common cold remedies, decrease consumption of alcohol, perform routine monitoring of BP with home BP cuff, exercise, reduction of dietary salt, take medicines as prescribed, try not to miss doses and quit smoking.  Regular exercise and maintaining a healthy weight is needed.  Stress reduction may help. A CLINICAL SUMMARY including written plan identify barriers to care unique to individual due to social or financial issues.  We attempt to mutually creat solutions for individual and family understanding.      Endocrine   Testicular hypofunction   Relevant Orders   Testosterone,Free and Total Patient has low testosterone and on supplements     Musculoskeletal and Integument   Actinic keratosis of scalp Actinic area on scalp  need removal by dermatology     Other   Mixed hyperlipidemia - Primary   Relevant Orders   Lipid panel AN INDIVIDUAL CARE PLAN for hyperlipidemia/ cholesterol was established and reinforced today.  The patient's status was assessed using clinical findings on exam, lab and other diagnostic tests. The patient's disease status was assessed based on evidence-based guidelines and found to be fair controlled. MEDICATIONS were reviewed. SELF MANAGEMENT GOALS have been discussed and patient's success at attaining the goal of low cholesterol was assessed. RECOMMENDATION given include regular exercise 3 days a week and low cholesterol/low fat diet. CLINICAL SUMMARY including written plan to identify barriers unique to the patient due to social or economic  reasons was discussed.    BMI 29.0-29.9,adult We discussed diet and exercise   Other Visit Diagnoses     Benign prostatic hyperplasia without lower urinary tract symptoms       Relevant Orders   PSA AN INDIVIDUAL CARE PLAN for BPH was established and reinforced today.  The patient's status was assessed using clinical findings on exam, labs, and other diagnostic testing. Patient's success at meeting treatment goals based on disease specific evidence-bassed guidelines and found to be in good control. RECOMMENDATIONS include maintaining present medicines and treatment.    Encounter for special screening examination for HIV       Relevant Orders   HIV antibody (with reflex)     .    Orders Placed This Encounter  Procedures   Comprehensive metabolic panel   Lipid panel   CBC with Differential/Platelet   PSA   Testosterone,Free and Total   HIV antibody (with reflex)     Follow-up: Return in about 6 months (around 11/12/2022).  An After Visit Summary was printed and given to the patient.  Reinaldo Meeker, MD [facility] [phone]

## 2022-05-14 ENCOUNTER — Ambulatory Visit: Payer: 59 | Admitting: Legal Medicine

## 2022-05-14 ENCOUNTER — Encounter: Payer: Self-pay | Admitting: Legal Medicine

## 2022-05-14 VITALS — BP 132/72 | HR 70 | Temp 97.4°F | Ht 69.0 in | Wt 195.0 lb

## 2022-05-14 DIAGNOSIS — E291 Testicular hypofunction: Secondary | ICD-10-CM

## 2022-05-14 DIAGNOSIS — Z6829 Body mass index (BMI) 29.0-29.9, adult: Secondary | ICD-10-CM

## 2022-05-14 DIAGNOSIS — L570 Actinic keratosis: Secondary | ICD-10-CM

## 2022-05-14 DIAGNOSIS — I10 Essential (primary) hypertension: Secondary | ICD-10-CM

## 2022-05-14 DIAGNOSIS — Z114 Encounter for screening for human immunodeficiency virus [HIV]: Secondary | ICD-10-CM | POA: Diagnosis not present

## 2022-05-14 DIAGNOSIS — N400 Enlarged prostate without lower urinary tract symptoms: Secondary | ICD-10-CM | POA: Diagnosis not present

## 2022-05-14 DIAGNOSIS — E782 Mixed hyperlipidemia: Secondary | ICD-10-CM

## 2022-05-15 LAB — HIV ANTIBODY (ROUTINE TESTING W REFLEX): HIV Screen 4th Generation wRfx: NONREACTIVE

## 2022-05-15 NOTE — Progress Notes (Signed)
HIV negative,  lp

## 2022-05-17 LAB — CBC WITH DIFFERENTIAL/PLATELET
Basophils Absolute: 0.1 10*3/uL (ref 0.0–0.2)
Basos: 2 %
EOS (ABSOLUTE): 0.1 10*3/uL (ref 0.0–0.4)
Eos: 3 %
Hematocrit: 44.3 % (ref 37.5–51.0)
Hemoglobin: 15.3 g/dL (ref 13.0–17.7)
Immature Grans (Abs): 0 10*3/uL (ref 0.0–0.1)
Immature Granulocytes: 0 %
Lymphocytes Absolute: 1.1 10*3/uL (ref 0.7–3.1)
Lymphs: 27 %
MCH: 32.7 pg (ref 26.6–33.0)
MCHC: 34.5 g/dL (ref 31.5–35.7)
MCV: 95 fL (ref 79–97)
Monocytes Absolute: 0.6 10*3/uL (ref 0.1–0.9)
Monocytes: 14 %
Neutrophils Absolute: 2.3 10*3/uL (ref 1.4–7.0)
Neutrophils: 54 %
Platelets: 266 10*3/uL (ref 150–450)
RBC: 4.68 x10E6/uL (ref 4.14–5.80)
RDW: 11.3 % — ABNORMAL LOW (ref 11.6–15.4)
WBC: 4.2 10*3/uL (ref 3.4–10.8)

## 2022-05-17 LAB — COMPREHENSIVE METABOLIC PANEL
ALT: 27 IU/L (ref 0–44)
AST: 28 IU/L (ref 0–40)
Albumin/Globulin Ratio: 1.3 (ref 1.2–2.2)
Albumin: 4.7 g/dL (ref 3.8–4.9)
Alkaline Phosphatase: 53 IU/L (ref 44–121)
BUN/Creatinine Ratio: 21 — ABNORMAL HIGH (ref 9–20)
BUN: 19 mg/dL (ref 6–24)
Bilirubin Total: 1.8 mg/dL — ABNORMAL HIGH (ref 0.0–1.2)
CO2: 24 mmol/L (ref 20–29)
Calcium: 9.9 mg/dL (ref 8.7–10.2)
Chloride: 97 mmol/L (ref 96–106)
Creatinine, Ser: 0.9 mg/dL (ref 0.76–1.27)
Globulin, Total: 3.6 g/dL (ref 1.5–4.5)
Glucose: 98 mg/dL (ref 70–99)
Potassium: 4.6 mmol/L (ref 3.5–5.2)
Sodium: 136 mmol/L (ref 134–144)
Total Protein: 8.3 g/dL (ref 6.0–8.5)
eGFR: 100 mL/min/{1.73_m2} (ref >59)

## 2022-05-17 LAB — LIPID PANEL
Chol/HDL Ratio: 2.1 ratio (ref 0.0–5.0)
Cholesterol, Total: 186 mg/dL (ref 100–199)
HDL: 90 mg/dL (ref >39)
LDL Chol Calc (NIH): 85 mg/dL (ref 0–99)
Triglycerides: 56 mg/dL (ref 0–149)
VLDL Cholesterol Cal: 11 mg/dL (ref 5–40)

## 2022-05-17 LAB — PSA: Prostate Specific Ag, Serum: 5 ng/mL — ABNORMAL HIGH (ref 0.0–4.0)

## 2022-05-17 LAB — CARDIOVASCULAR RISK ASSESSMENT

## 2022-05-17 LAB — TESTOSTERONE,FREE AND TOTAL
Testosterone, Free: 2 pg/mL — ABNORMAL LOW (ref 7.2–24.0)
Testosterone: 128 ng/dL — ABNORMAL LOW (ref 264–916)

## 2022-05-17 NOTE — Progress Notes (Signed)
Kidney tests high, bilirubin rising, suggest liver ultrasound, CBC normal, PSA 5.0 lower that previously, testosterone 128, free testosterone 2.0- when was last dose? Has he seen urology with high P)SA and on testosterone? lp

## 2022-05-21 NOTE — Progress Notes (Signed)
HIV negative lp

## 2022-05-27 ENCOUNTER — Telehealth: Payer: Self-pay | Admitting: Orthopaedic Surgery

## 2022-05-27 NOTE — Telephone Encounter (Signed)
Patient wants to know if he needs meds before he goes to dentists. Best number to reach him [phone]

## 2022-06-04 ENCOUNTER — Other Ambulatory Visit (HOSPITAL_COMMUNITY): Payer: Self-pay

## 2022-06-05 ENCOUNTER — Telehealth: Payer: Self-pay

## 2022-06-05 NOTE — Telephone Encounter (Signed)
I left a message stating that I was called to get him scheduled for his 6 month f.up fasting from 05/14/2022 with Lonia Farber, NP. I asked that he call the office back to get that scheduled.

## 2022-06-18 ENCOUNTER — Ambulatory Visit: Payer: 59 | Admitting: Physician Assistant

## 2022-06-18 ENCOUNTER — Ambulatory Visit (INDEPENDENT_AMBULATORY_CARE_PROVIDER_SITE_OTHER): Payer: 59

## 2022-06-18 ENCOUNTER — Encounter: Payer: Self-pay | Admitting: Physician Assistant

## 2022-06-18 DIAGNOSIS — Z96641 Presence of right artificial hip joint: Secondary | ICD-10-CM | POA: Diagnosis not present

## 2022-06-18 NOTE — Progress Notes (Signed)
HPI: Nathan Hughes returns today 8 months status post right total hip arthroplasty.  He is overall doing well.  He does have some arthritis in his left hip that is not bothering him very much at this point in time.  In regards to his right hip he is very happy with the results.  He has no pain in the hip whatsoever.  States his incision healed well.   Review of systems: See HPI otherwise negative  Physical exam: General pleasant well-developed male in no acute distress. Psych: Alert and oriented x 3 Bilateral hips: Good range of motion of both hips without pain.  He is able to cross his right leg over the left.  Right calf supple nontender.  Ambulates without any assistive device.  Walks without an antalgic gait.  Radiographs: AP pelvis and lateral view of the right hip: Status post right total hip arthroplasty bilateral hips well located.  No acute fractures or acute findings.  Components appear to be well-seated.   Impression: Status post right total hip arthroplasty 10/08/2021  Plan: He will follow-up with [place] as needed.  Questions were encouraged and answered at length.

## 2022-07-04 ENCOUNTER — Other Ambulatory Visit: Payer: Self-pay | Admitting: Legal Medicine

## 2022-07-04 ENCOUNTER — Other Ambulatory Visit (HOSPITAL_COMMUNITY): Payer: Self-pay

## 2022-07-04 DIAGNOSIS — E291 Testicular hypofunction: Secondary | ICD-10-CM

## 2022-07-08 ENCOUNTER — Other Ambulatory Visit: Payer: Self-pay | Admitting: Legal Medicine

## 2022-07-08 ENCOUNTER — Other Ambulatory Visit (HOSPITAL_COMMUNITY): Payer: Self-pay

## 2022-07-08 DIAGNOSIS — E291 Testicular hypofunction: Secondary | ICD-10-CM

## 2022-07-08 MED ORDER — TESTOSTERONE CYPIONATE 200 MG/ML IM SOLN
200.0000 mg | INTRAMUSCULAR | 0 refills | Status: DC
  Filled 2022-07-08: qty 6, 84d supply, fill #0
  Filled 2022-10-06: qty 4, 56d supply, fill #1

## 2022-07-09 ENCOUNTER — Other Ambulatory Visit: Payer: Self-pay

## 2022-07-09 ENCOUNTER — Other Ambulatory Visit (HOSPITAL_COMMUNITY): Payer: Self-pay

## 2022-07-10 ENCOUNTER — Other Ambulatory Visit (HOSPITAL_COMMUNITY): Payer: Self-pay

## 2022-08-15 ENCOUNTER — Other Ambulatory Visit (HOSPITAL_COMMUNITY): Payer: Self-pay

## 2022-08-20 ENCOUNTER — Other Ambulatory Visit (HOSPITAL_COMMUNITY): Payer: Self-pay

## 2022-09-04 ENCOUNTER — Encounter: Payer: Self-pay | Admitting: Radiology

## 2022-10-06 ENCOUNTER — Other Ambulatory Visit (HOSPITAL_COMMUNITY): Payer: Self-pay

## 2022-10-27 ENCOUNTER — Other Ambulatory Visit: Payer: Self-pay

## 2022-10-27 DIAGNOSIS — I10 Essential (primary) hypertension: Secondary | ICD-10-CM

## 2022-10-27 MED ORDER — LOSARTAN POTASSIUM 50 MG PO TABS: 50.0000 mg | ORAL_TABLET | Freq: Every day | ORAL | 0 refills | Status: DC

## 2022-10-29 ENCOUNTER — Other Ambulatory Visit: Payer: Self-pay

## 2022-10-29 ENCOUNTER — Other Ambulatory Visit (HOSPITAL_COMMUNITY): Payer: Self-pay

## 2022-10-29 DIAGNOSIS — I10 Essential (primary) hypertension: Secondary | ICD-10-CM

## 2022-10-29 MED ORDER — LOSARTAN POTASSIUM 50 MG PO TABS
50.0000 mg | ORAL_TABLET | Freq: Every day | ORAL | 0 refills | Status: DC
Start: 2022-10-29 — End: 2022-12-17
  Filled 2022-10-29: qty 90, 90d supply, fill #0

## 2022-12-15 ENCOUNTER — Telehealth: Payer: Self-pay

## 2022-12-15 NOTE — Telephone Encounter (Signed)
I left a message on the number(s) listed in the patients chart requesting the patient to call back regarding the upcomming appointment for 12/16/2022. The provider is out of the office due to an emergency. The appointment has been canceled. Waiting for the patient to return the call.  

## 2022-12-16 ENCOUNTER — Ambulatory Visit: Payer: Self-pay | Admitting: Physician Assistant

## 2022-12-17 ENCOUNTER — Other Ambulatory Visit: Payer: Self-pay | Admitting: Family Medicine

## 2022-12-17 ENCOUNTER — Other Ambulatory Visit (HOSPITAL_COMMUNITY): Payer: Self-pay

## 2022-12-17 DIAGNOSIS — I10 Essential (primary) hypertension: Secondary | ICD-10-CM

## 2022-12-17 DIAGNOSIS — E291 Testicular hypofunction: Secondary | ICD-10-CM

## 2022-12-17 MED ORDER — LOSARTAN POTASSIUM 50 MG PO TABS
50.0000 mg | ORAL_TABLET | Freq: Every day | ORAL | 0 refills | Status: DC
Start: 2022-12-17 — End: 2023-04-22
  Filled 2022-12-17 – 2023-01-22 (×2): qty 90, 90d supply, fill #0

## 2022-12-17 MED ORDER — TESTOSTERONE CYPIONATE 200 MG/ML IM SOLN
200.0000 mg | INTRAMUSCULAR | 0 refills | Status: DC
Start: 2022-12-17 — End: 2023-01-09
  Filled 2022-12-17: qty 6, 84d supply, fill #0

## 2023-01-05 NOTE — Progress Notes (Signed)
Subjective:  Patient ID: Nathan Hughes, male    DOB: 02-May-1966  Age: 57 y.o. MRN: [id number]  Chief Complaint  Patient presents with   Medical Management of Chronic Issues    HPI   Patient presents for follow up of hypertension.  Patient tolerating Losartan 50 mg daily, aspirin 81 mg daily well without side effects. Patient is working on maintaining diet and exercise regimen and follows up as directed.   Patient presents with hyperlipidemia.  Compliance with treatment has been good; patient takes medicines as directed, maintains low cholesterol diet, follows up as directed, and maintains exercise regimen.  Patient is using Fish oil 1000 mg daily without problems.   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 Last 2 weeks ago. Testosterone was low last time and he complains about increased fatigue and minor ED.      01/06/2023    7:23 AM 05/14/2022    7:50 AM 05/07/2021    8:15 AM 04/12/2020    7:52 AM 04/12/2020    7:45 AM  Depression screen PHQ 2/9  Decreased Interest 0 0 0 0 0  Down, Depressed, Hopeless 0 0 0 0 0  PHQ - 2 Score 0 0 0 0 0        01/06/2023    7:23 AM  Fall Risk   Falls in the past year? 0  Number falls in past yr: 0  Injury with Fall? 0  Risk for fall due to : No Fall Risks  Follow up Falls evaluation completed    Patient Care Team: Langley Gauss, [place] as PCP - General (Physician Assistant)   Review of Systems  Constitutional:  Negative for fatigue.  HENT:  Negative for congestion, ear pain and sore throat.   Respiratory:  Negative for cough and shortness of breath.   Cardiovascular:  Negative for chest pain.  Gastrointestinal:  Negative for abdominal pain, constipation, diarrhea, nausea and vomiting.  Genitourinary:  Negative for dysuria, frequency and urgency.  Musculoskeletal:  Negative for arthralgias, back pain and myalgias.  Neurological:  Negative for dizziness and headaches.  Psychiatric/Behavioral:  Negative  for agitation and sleep disturbance. The patient is not nervous/anxious.     Current Outpatient Medications on File Prior to Visit  Medication Sig Dispense Refill   aspirin 81 MG chewable tablet Chew 1 tablet (81 mg total) by mouth 2 (two) times daily. 35 tablet 0   Glucosamine-Chondroit-Vit C-Mn (GLUCOSAMINE-CHONDROITIN) TABS Take 2 tablets by mouth daily.     losartan (COZAAR) 50 MG tablet Take 1 tablet (50 mg total) by mouth daily. 90 tablet 0   Multiple Vitamins-Minerals (MENS MULTIVITAMIN) CHEW Chew 2 tablets by mouth daily.     Omega-3 Fatty Acids (FISH OIL) 1000 MG CAPS Take 1,000 mg by mouth daily.     testosterone cypionate (DEPOTESTOSTERONE CYPIONATE) 200 MG/ML injection Inject 1 mL (200 mg total) into the muscle every 14 (fourteen) days. 10 mL 0   No current facility-administered medications on file prior to visit.   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   Past Surgical History:  Procedure Laterality Date   INGUINAL HERNIA REPAIR Right 1997   JOINT REPLACEMENT Right 10/08/2021   TOTAL HIP ARTHROPLASTY Right 10/08/2021   Procedure: Right TOTAL HIP ARTHROPLASTY ANTERIOR APPROACH;  Surgeon: Kathryne Hitch, MD;  Location: [facility] OR;  Service:  Orthopedics;  Laterality: Right;   VASECTOMY      Family History  Problem Relation Age of Onset   Stroke Father    Heart disease Father    Hypokalemia Sister    Kidney disease Sister    Social History   Socioeconomic History   Marital status: Married    Spouse name: Not on file   Number of children: 2   Years of education: Not on file   Highest education level: Bachelor's degree (e.g., BA, AB, BS)  Occupational History   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[employer]   Occupation: Emergency planning/management officer  Tobacco Use   Smoking status: Never   Smokeless tobacco: Never   Vaping Use   Vaping Use: Never used  Substance and Sexual Activity   Alcohol use: Yes    Alcohol/week: 4.0 standard drinks of alcohol    Types: 4 Cans of beer per week    Comment: ocassionally   Drug use: Never   Sexual activity: Yes    Partners: Female  Other Topics Concern   Not on file  Social History Narrative   Not on file   Social Determinants of Health   Financial Resource Strain: Low Risk  (01/03/2023)   Overall Financial Resource Strain (CARDIA)    Difficulty of Paying Living Expenses: Not hard at all  Food Insecurity: No Food Insecurity (01/03/2023)   Hunger Vital Sign    Worried About Running Out of Food in the Last Year: Never true    Ran Out of Food in the Last Year: Never true  Transportation Needs: No Transportation Needs (01/03/2023)   PRAPARE - Administrator, Civil Service (Medical): No    Lack of Transportation (Non-Medical): No  Physical Activity: Sufficiently Active (01/03/2023)   Exercise Vital Sign    Days of Exercise per Week: 7 days    Minutes of Exercise per Session: 30 min  Stress: No Stress Concern Present (01/03/2023)   [employer] of Occupational Health - Occupational Stress Questionnaire    Feeling of Stress : Not at all  Social Connections: Moderately Integrated (01/03/2023)   Social Connection and Isolation Panel [NHANES]    Frequency of Communication with Friends and Family: More than three times a week    Frequency of Social Gatherings with Friends and Family: Twice a week    Attends Religious Services: More than 4 times per year    Active Member of [employer] or Organizations: No    Attends Engineer, structural: Not on file    Marital Status: Married    Objective:  BP 130/84 (BP Location: Left Arm, Patient Position: Sitting, Cuff Size: Large)   Pulse 71   Temp (!) 97.2 F (36.2 C) (Temporal)   Ht 5\' 9"  (1.753 m)   Wt 191 lb 6.4 oz (86.8 kg)   SpO2 97%   BMI 28.26 kg/m      01/06/2023    7:20 AM 05/14/2022    7:33  AM 01/14/2022    7:52 AM  BP/Weight  Systolic BP 130 132 112  Diastolic BP 84 72 80  Wt. (Lbs) 191.4 195 199.6  BMI 28.26 kg/m2 28.8 kg/m2 29.48 kg/m2    Physical Exam Vitals reviewed.  Constitutional:      Appearance: Normal appearance.  Cardiovascular:     Rate and Rhythm: Normal rate and regular rhythm.     Heart sounds: Normal heart sounds.  Pulmonary:     Effort: Pulmonary effort is normal.  Breath sounds: Normal breath sounds.  Abdominal:     General: Bowel sounds are normal.     Palpations: Abdomen is soft.     Tenderness: There is no abdominal tenderness.  Neurological:     Mental Status: He is alert and oriented to person, place, and time.  Psychiatric:        Mood and Affect: Mood normal.        Behavior: Behavior normal.     Diabetic Foot Exam - Simple   No data filed      Lab Results  Component Value Date   WBC 4.2 05/14/2022   HGB 15.3 05/14/2022   HCT 44.3 05/14/2022   PLT 266 05/14/2022   GLUCOSE 98 05/14/2022   CHOL 186 05/14/2022   TRIG 56 05/14/2022   HDL 90 05/14/2022   LDLCALC 85 05/14/2022   ALT 27 05/14/2022   AST 28 05/14/2022   NA 136 05/14/2022   K 4.6 05/14/2022   CL 97 05/14/2022   CREATININE 0.90 05/14/2022   BUN 19 05/14/2022   CO2 24 05/14/2022   INR 1.0 07/26/2021   HGBA1C 5.0 07/26/2021      Assessment & Plan:    Benign essential hypertension Assessment & Plan: Well controlled.  Continue to work on eating a healthy diet and exercise.  Labs drawn today.   No major side effects reported, and no issues with compliance. The current medical regimen is effective;  continue present plan with Losartan 50mg , aspirin 81mg  Will adjust medication as needed depending on labs BP Readings from Last 3 Encounters:  01/06/23 130/84  05/14/22 132/72  01/14/22 112/80     Orders: -     CBC with Differential/Platelet -     Comprehensive metabolic panel  Mixed hyperlipidemia Assessment & Plan: Well controlled.  Continue  to work on eating a healthy diet and exercise.  Labs drawn today.   No major side effects reported, and no issues with compliance. The current medical regimen is effective;  continue present plan with Omega 3 fatty acid Will adjust medication as needed depending on labs Lab Results  Component Value Date   LDLCALC 85 05/14/2022     Orders: -     Lipid panel  Testicular hypofunction Assessment & Plan: Uncontrolled Labs drawn today Will adjust medication as needed while monitoring PSA levels Denies any side effects or issue with the medication.   Orders: -     Testosterone,Free and Total  Actinic keratosis of scalp Assessment & Plan: Spot on top of the head and middle right side of his back. Referral to dermatology to have it frozen off  Orders: -     Ambulatory referral to Dermatology  Benign prostatic hyperplasia without lower urinary tract symptoms Assessment & Plan: PSA chronically elevated Saw Urology and states everything was normal Labs drawn today Will continue to monitor along with testosterone injections.   Orders: -     PSA     No orders of the defined types were placed in this encounter.   Orders Placed This Encounter  Procedures   CBC with Differential/Platelet   Comprehensive metabolic panel   Lipid panel   PSA   Testosterone,Free and Total   Ambulatory referral to Dermatology     Follow-up: Return in about 6 months (around 07/09/2023) for Chronic, fasting, Huston Foley.   I,Marla I Leal-Borjas,acting as a scribe for [employer], PA.,have documented all relevant documentation on the behalf of Langley Gauss, PA,as directed by  Langley Gauss, PA  while in the presence of Langley Gauss, [place].   An After Visit Summary was printed and given to the patient.  Langley Gauss, [place] [facility] [phone]

## 2023-01-06 ENCOUNTER — Ambulatory Visit: Payer: Commercial Managed Care - PPO | Admitting: Physician Assistant

## 2023-01-06 ENCOUNTER — Encounter: Payer: Self-pay | Admitting: Physician Assistant

## 2023-01-06 VITALS — BP 130/84 | HR 71 | Temp 97.2°F | Ht 69.0 in | Wt 191.4 lb

## 2023-01-06 DIAGNOSIS — E782 Mixed hyperlipidemia: Secondary | ICD-10-CM | POA: Diagnosis not present

## 2023-01-06 DIAGNOSIS — L570 Actinic keratosis: Secondary | ICD-10-CM

## 2023-01-06 DIAGNOSIS — N400 Enlarged prostate without lower urinary tract symptoms: Secondary | ICD-10-CM | POA: Insufficient documentation

## 2023-01-06 DIAGNOSIS — E291 Testicular hypofunction: Secondary | ICD-10-CM | POA: Diagnosis not present

## 2023-01-06 DIAGNOSIS — I10 Essential (primary) hypertension: Secondary | ICD-10-CM | POA: Diagnosis not present

## 2023-01-06 NOTE — Assessment & Plan Note (Signed)
Uncontrolled Labs drawn today Will adjust medication as needed while monitoring PSA levels Denies any side effects or issue with the medication.

## 2023-01-06 NOTE — Assessment & Plan Note (Signed)
PSA chronically elevated Saw Urology and states everything was normal Labs drawn today Will continue to monitor along with testosterone injections.

## 2023-01-06 NOTE — Assessment & Plan Note (Signed)
Spot on top of the head and middle right side of his back. Referral to dermatology to have it frozen off

## 2023-01-06 NOTE — Assessment & Plan Note (Signed)
Well controlled.  Continue to work on eating a healthy diet and exercise.  Labs drawn today.   No major side effects reported, and no issues with compliance. The current medical regimen is effective;  continue present plan with Omega 3 fatty acid Will adjust medication as needed depending on labs Lab Results  Component Value Date   LDLCALC 85 05/14/2022

## 2023-01-06 NOTE — Assessment & Plan Note (Signed)
Well controlled.  Continue to work on eating a healthy diet and exercise.  Labs drawn today.   No major side effects reported, and no issues with compliance. The current medical regimen is effective;  continue present plan with Losartan 50mg , aspirin 81mg  Will adjust medication as needed depending on labs BP Readings from Last 3 Encounters:  01/06/23 130/84  05/14/22 132/72  01/14/22 112/80

## 2023-01-07 LAB — COMPREHENSIVE METABOLIC PANEL
ALT: 21 IU/L (ref 0–44)
AST: 22 IU/L (ref 0–40)
Albumin: 4.6 g/dL (ref 3.8–4.9)
Alkaline Phosphatase: 49 IU/L (ref 44–121)
BUN/Creatinine Ratio: 19 (ref 9–20)
BUN: 18 mg/dL (ref 6–24)
Bilirubin Total: 2.2 mg/dL — ABNORMAL HIGH (ref 0.0–1.2)
CO2: 24 mmol/L (ref 20–29)
Calcium: 9.8 mg/dL (ref 8.7–10.2)
Chloride: 98 mmol/L (ref 96–106)
Creatinine, Ser: 0.97 mg/dL (ref 0.76–1.27)
Globulin, Total: 3.4 g/dL (ref 1.5–4.5)
Glucose: 88 mg/dL (ref 70–99)
Potassium: 5 mmol/L (ref 3.5–5.2)
Sodium: 136 mmol/L (ref 134–144)
Total Protein: 8 g/dL (ref 6.0–8.5)
eGFR: 91 mL/min/{1.73_m2} (ref >59)

## 2023-01-07 LAB — CBC WITH DIFFERENTIAL/PLATELET
Basophils Absolute: 0.1 10*3/uL (ref 0.0–0.2)
Basos: 2 %
EOS (ABSOLUTE): 0.2 10*3/uL (ref 0.0–0.4)
Eos: 3 %
Hematocrit: 44.8 % (ref 37.5–51.0)
Hemoglobin: 15.4 g/dL (ref 13.0–17.7)
Immature Grans (Abs): 0 10*3/uL (ref 0.0–0.1)
Immature Granulocytes: 0 %
Lymphocytes Absolute: 1.2 10*3/uL (ref 0.7–3.1)
Lymphs: 26 %
MCH: 33 pg (ref 26.6–33.0)
MCHC: 34.4 g/dL (ref 31.5–35.7)
MCV: 96 fL (ref 79–97)
Monocytes Absolute: 0.7 10*3/uL (ref 0.1–0.9)
Monocytes: 15 %
Neutrophils Absolute: 2.6 10*3/uL (ref 1.4–7.0)
Neutrophils: 54 %
Platelets: 250 10*3/uL (ref 150–450)
RBC: 4.66 x10E6/uL (ref 4.14–5.80)
RDW: 11.5 % — ABNORMAL LOW (ref 11.6–15.4)
WBC: 4.8 10*3/uL (ref 3.4–10.8)

## 2023-01-07 LAB — TESTOSTERONE,FREE AND TOTAL
Testosterone, Free: 2.2 pg/mL — ABNORMAL LOW (ref 7.2–24.0)
Testosterone: 156 ng/dL — ABNORMAL LOW (ref 264–916)

## 2023-01-07 LAB — LIPID PANEL
Chol/HDL Ratio: 2 ratio (ref 0.0–5.0)
Cholesterol, Total: 174 mg/dL (ref 100–199)
HDL: 89 mg/dL (ref >39)
LDL Chol Calc (NIH): 73 mg/dL (ref 0–99)
Triglycerides: 64 mg/dL (ref 0–149)
VLDL Cholesterol Cal: 12 mg/dL (ref 5–40)

## 2023-01-07 LAB — PSA: Prostate Specific Ag, Serum: 6.2 ng/mL — ABNORMAL HIGH (ref 0.0–4.0)

## 2023-01-09 ENCOUNTER — Other Ambulatory Visit: Payer: Self-pay | Admitting: Physician Assistant

## 2023-01-09 DIAGNOSIS — E291 Testicular hypofunction: Secondary | ICD-10-CM

## 2023-01-09 MED ORDER — TESTOSTERONE CYPIONATE 200 MG/ML IM SOLN
225.0000 mg | INTRAMUSCULAR | 0 refills | Status: DC
Start: 2023-01-09 — End: 2023-03-20

## 2023-01-22 ENCOUNTER — Other Ambulatory Visit (HOSPITAL_COMMUNITY): Payer: Self-pay

## 2023-01-23 ENCOUNTER — Other Ambulatory Visit (HOSPITAL_COMMUNITY): Payer: Self-pay

## 2023-02-25 ENCOUNTER — Other Ambulatory Visit: Payer: Commercial Managed Care - PPO

## 2023-02-25 DIAGNOSIS — E291 Testicular hypofunction: Secondary | ICD-10-CM

## 2023-02-27 LAB — TESTOSTERONE,FREE AND TOTAL
Testosterone, Free: 34.5 pg/mL — ABNORMAL HIGH (ref 7.2–24.0)
Testosterone: >1500 ng/dL — ABNORMAL HIGH (ref 264–916)

## 2023-02-27 LAB — PSA: Prostate Specific Ag, Serum: 2.6 ng/mL (ref 0.0–4.0)

## 2023-03-05 ENCOUNTER — Other Ambulatory Visit: Payer: Self-pay

## 2023-03-05 DIAGNOSIS — E291 Testicular hypofunction: Secondary | ICD-10-CM

## 2023-03-09 ENCOUNTER — Other Ambulatory Visit: Payer: Commercial Managed Care - PPO

## 2023-03-09 DIAGNOSIS — E291 Testicular hypofunction: Secondary | ICD-10-CM

## 2023-03-12 LAB — TESTOSTERONE,FREE AND TOTAL
Testosterone, Free: 3.2 pg/mL — ABNORMAL LOW (ref 7.2–24.0)
Testosterone: 183 ng/dL — ABNORMAL LOW (ref 264–916)

## 2023-03-18 ENCOUNTER — Other Ambulatory Visit: Payer: Self-pay | Admitting: Physician Assistant

## 2023-03-18 ENCOUNTER — Other Ambulatory Visit (HOSPITAL_COMMUNITY): Payer: Self-pay

## 2023-03-18 DIAGNOSIS — E291 Testicular hypofunction: Secondary | ICD-10-CM

## 2023-03-19 ENCOUNTER — Other Ambulatory Visit (HOSPITAL_COMMUNITY): Payer: Self-pay

## 2023-03-20 ENCOUNTER — Other Ambulatory Visit (HOSPITAL_COMMUNITY): Payer: Self-pay

## 2023-03-20 ENCOUNTER — Other Ambulatory Visit: Payer: Self-pay

## 2023-03-20 DIAGNOSIS — E291 Testicular hypofunction: Secondary | ICD-10-CM

## 2023-03-20 MED ORDER — TESTOSTERONE CYPIONATE 200 MG/ML IM SOLN
225.0000 mg | INTRAMUSCULAR | 0 refills | Status: DC
Start: 2023-03-20 — End: 2023-03-24

## 2023-03-23 ENCOUNTER — Other Ambulatory Visit: Payer: Self-pay

## 2023-03-24 ENCOUNTER — Other Ambulatory Visit: Payer: Self-pay

## 2023-03-24 ENCOUNTER — Other Ambulatory Visit (HOSPITAL_COMMUNITY): Payer: Self-pay

## 2023-03-24 DIAGNOSIS — E291 Testicular hypofunction: Secondary | ICD-10-CM

## 2023-03-24 MED ORDER — TESTOSTERONE CYPIONATE 200 MG/ML IM SOLN
225.0000 mg | INTRAMUSCULAR | 0 refills | Status: DC
Start: 2023-03-24 — End: 2023-08-24
  Filled 2023-03-24: qty 10, 70d supply, fill #0

## 2023-04-01 ENCOUNTER — Telehealth: Payer: Self-pay

## 2023-04-01 NOTE — Telephone Encounter (Signed)
Nathan Hughes called this morning requesting that his physical exam note to be faxed to [employer] for the [facility] insurance. Pt was notified that he did not have a CPE for 2024. The pt was under the impression that the DOS 01/06/2023 was a CPE. Pt was notified that this was a 6 month f.up appt. The pt is requesting if possible that this DOS be changed to a CPE.   Please advise.

## 2023-04-01 NOTE — Telephone Encounter (Signed)
I left a message on the patients phone stating that due to the visit being so far back and having it already been billed to insurance we are unable to change the note. If he would like to schedule that appointment type for the future we can schedule. I left the office phone number if he has any questions or concerns to call the office back.

## 2023-04-05 ENCOUNTER — Other Ambulatory Visit (HOSPITAL_COMMUNITY): Payer: Self-pay

## 2023-04-16 ENCOUNTER — Other Ambulatory Visit (HOSPITAL_BASED_OUTPATIENT_CLINIC_OR_DEPARTMENT_OTHER): Payer: Self-pay

## 2023-04-22 ENCOUNTER — Other Ambulatory Visit (HOSPITAL_COMMUNITY): Payer: Self-pay

## 2023-04-22 ENCOUNTER — Other Ambulatory Visit: Payer: Self-pay | Admitting: Physician Assistant

## 2023-04-22 DIAGNOSIS — I10 Essential (primary) hypertension: Secondary | ICD-10-CM

## 2023-04-22 MED ORDER — LOSARTAN POTASSIUM 50 MG PO TABS
50.0000 mg | ORAL_TABLET | Freq: Every day | ORAL | 0 refills | Status: DC
Start: 2023-04-22 — End: 2023-07-08
  Filled 2023-04-22: qty 90, 90d supply, fill #0

## 2023-07-07 NOTE — Progress Notes (Signed)
 Subjective:  Patient ID: Nathan Hughes, male    DOB: [birth year]  Age: 58 y.o. MRN: [id number]  Chief Complaint  Patient presents with   Medical Management of Chronic Issues    HPI   Patient presents for follow up of hypertension.  Patient tolerating Losartan  50 mg daily, aspirin  81 mg daily well without side effects. Patient is working on maintaining diet and exercise regimen and follows up as directed.   Patient presents with hyperlipidemia.  Compliance with treatment has been good; patient takes medicines as directed, maintains low cholesterol diet, follows up as directed, and maintains exercise regimen.  Patient is using Fish oil 1000 mg daily without problems.   Testicular hypofunction: He takes testosterone  225 mg/ml every 14 days.   Discussed the use of AI scribe software for clinical note transcription with the patient, who gave verbal consent to proceed.  History of Present Illness   The patient, a 58 year old male with a history of fluctuating testosterone  levels, presents for a routine follow-up. The patient reports no new symptoms or changes since the last visit. He is currently on testosterone  therapy, self-administering injections from a vial. The patient has been managing his cholesterol levels through diet and has a family history of heart disease and high cholesterol. He has not experienced any chest pain or other symptoms related to heart disease. The patient recently changed his insurance and plans to have blood work done next week. The patient's sister recently passed away due to complications from a medical condition, which has been a source of stress for the patient.          07/08/2023    7:40 AM 01/06/2023    7:23 AM 05/14/2022    7:50 AM 05/07/2021    8:15 AM 04/12/2020    7:52 AM  Depression screen PHQ 2/9  Decreased Interest 0 0 0 0 0  Down, Depressed, Hopeless 0 0 0 0 0  PHQ - 2 Score 0 0 0 0 0  Altered sleeping 0      Tired, decreased energy 0       Change in appetite 0      Feeling bad or failure about yourself  0      Trouble concentrating 0      Moving slowly or fidgety/restless 0      Suicidal thoughts 0      PHQ-9 Score 0      Difficult doing work/chores Not difficult at all            07/08/2023    7:40 AM  Fall Risk   Falls in the past year? 0  Number falls in past yr: 0  Injury with Fall? 0  Risk for fall due to : No Fall Risks  Follow up Falls evaluation completed    Patient Care Team: Milon Cleaves, [place] as PCP - General (Physician Assistant)   Review of Systems  Constitutional:  Negative for chills, fatigue and fever.  HENT:  Negative for congestion, ear pain and sore throat.   Respiratory:  Negative for cough and shortness of breath.   Cardiovascular:  Negative for chest pain and palpitations.  Gastrointestinal:  Negative for abdominal pain, constipation, diarrhea, nausea and vomiting.  Genitourinary:  Negative for difficulty urinating and dysuria.  Musculoskeletal:  Negative for arthralgias, back pain and myalgias.  Skin:  Negative for rash.  Neurological:  Negative for dizziness and headaches.  Psychiatric/Behavioral:  Negative for dysphoric mood.     Current Outpatient Medications  on File Prior to Visit  Medication Sig Dispense Refill   aspirin  81 MG chewable tablet Chew 1 tablet (81 mg total) by mouth 2 (two) times daily. 35 tablet 0   Glucosamine-Chondroit-Vit C-Mn (GLUCOSAMINE-CHONDROITIN) TABS Take 2 tablets by mouth daily.     Multiple Vitamins-Minerals (MENS MULTIVITAMIN) CHEW Chew 2 tablets by mouth daily.     testosterone  cypionate (DEPOTESTOSTERONE CYPIONATE) 200 MG/ML injection Inject 1.125 mLs (225 mg total) into the muscle every 14 (fourteen) days. 10 mL 0   No current facility-administered medications on file prior to visit.   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   Testicular  hypofunction 10/12/2019   Past Surgical History:  Procedure Laterality Date   INGUINAL HERNIA REPAIR Right 1997   JOINT REPLACEMENT Right 10/08/2021   TOTAL HIP ARTHROPLASTY Right 10/08/2021   Procedure: Right TOTAL HIP ARTHROPLASTY ANTERIOR APPROACH;  Surgeon: Vernetta Lonni GRADE, MD;  Location: [facility] OR;  Service: Orthopedics;  Laterality: Right;   VASECTOMY      Family History  Problem Relation Age of Onset   Stroke Father    Heart disease Father    Hypokalemia Sister    Kidney disease Sister    Social History   Socioeconomic History   Marital status: Married    Spouse name: Not on file   Number of children: 2   Years of education: Not on file   Highest education level: Bachelor's degree (e.g., BA, AB, BS)  Occupational History   Occupation: Event Organiser: [employer]   Occupation: Emergency planning/management officer  Tobacco Use   Smoking status: Never   Smokeless tobacco: Never  Vaping Use   Vaping status: Never Used  Substance and Sexual Activity   Alcohol use: Yes    Alcohol/week: 4.0 standard drinks of alcohol    Types: 4 Cans of beer per week    Comment: ocassionally   Drug use: Never   Sexual activity: Yes    Partners: Female  Other Topics Concern   Not on file  Social History Narrative   Not on file   Social Drivers of Health   Financial Resource Strain: Low Risk  (01/03/2023)   Overall Financial Resource Strain (CARDIA)    Difficulty of Paying Living Expenses: Not hard at all  Food Insecurity: No Food Insecurity (01/03/2023)   Hunger Vital Sign    Worried About Running Out of Food in the Last Year: Never true    Ran Out of Food in the Last Year: Never true  Transportation Needs: No Transportation Needs (01/03/2023)   PRAPARE - Administrator, Civil Service (Medical): No    Lack of Transportation (Non-Medical): No  Physical Activity: Sufficiently Active (01/03/2023)   Exercise Vital Sign    Days of Exercise per Week: 7 days    Minutes of  Exercise per Session: 30 min  Stress: No Stress Concern Present (01/03/2023)   [employer] of Occupational Health - Occupational Stress Questionnaire    Feeling of Stress : Not at all  Social Connections: Moderately Integrated (01/03/2023)   Social Connection and Isolation Panel [NHANES]    Frequency of Communication with Friends and Family: More than three times a week    Frequency of Social Gatherings with Friends and Family: Twice a week    Attends Religious Services: More than 4 times per year    Active  Member of Clubs or Organizations: No    Attends Engineer, Structural: Not on file    Marital Status: Married    Objective:  BP 130/70 (BP Location: Left Arm, Patient Position: Sitting, Cuff Size: Large)   Pulse 87   Temp 97.9 F (36.6 C) (Temporal)   Resp 16   Ht 5' 9 (1.753 m)   Wt 195 lb 12.8 oz (88.8 kg)   SpO2 100%   BMI 28.91 kg/m      07/08/2023    7:41 AM 01/06/2023    7:20 AM 05/14/2022    7:33 AM  BP/Weight  Systolic BP 130 130 132  Diastolic BP 70 84 72  Wt. (Lbs) 195.8 191.4 195  BMI 28.91 kg/m2 28.26 kg/m2 28.8 kg/m2    Physical Exam Vitals reviewed.  Constitutional:      Appearance: Normal appearance.  Cardiovascular:     Rate and Rhythm: Normal rate and regular rhythm.     Heart sounds: Normal heart sounds.  Pulmonary:     Effort: Pulmonary effort is normal.     Breath sounds: Normal breath sounds.  Abdominal:     General: Bowel sounds are normal.     Palpations: Abdomen is soft.     Tenderness: There is no abdominal tenderness.  Neurological:     Mental Status: He is alert and oriented to person, place, and time.  Psychiatric:        Mood and Affect: Mood normal.        Behavior: Behavior normal.     Diabetic Foot Exam - Simple   No data filed      Lab Results  Component Value Date   WBC 4.8 01/06/2023   HGB 15.4 01/06/2023   HCT 44.8 01/06/2023   PLT 250 01/06/2023   GLUCOSE 88 01/06/2023   CHOL 174 01/06/2023   TRIG  64 01/06/2023   HDL 89 01/06/2023   LDLCALC 73 01/06/2023   ALT 21 01/06/2023   AST 22 01/06/2023   NA 136 01/06/2023   K 5.0 01/06/2023   CL 98 01/06/2023   CREATININE 0.97 01/06/2023   BUN 18 01/06/2023   CO2 24 01/06/2023   INR 1.0 07/26/2021   HGBA1C 5.0 07/26/2021      Assessment & Plan:    Benign essential hypertension Assessment & Plan: Patient currently on Losartan  with 15 days left of medication. -Send refill for Losartan  to [facility].  Orders: -     CBC with Differential/Platelet; Future -     Comprehensive metabolic panel; Future -     Losartan  Potassium; Take 1 tablet (50 mg total) by mouth daily.  Dispense: 90 tablet; Refill: 0  Mixed hyperlipidemia Assessment & Plan: Family history of heart disease. Patient's previous cholesterol panel showed good control with diet management. -Order cholesterol panel with upcoming blood work. -Consider starting medication if LDL cholesterol exceeds 100.  Orders: -     Lipid panel; Future  Testicular hypofunction Assessment & Plan: Patient self-administering injections with variable dosing. No reported side effects such as aggression, headaches, or dizziness. -Continue current regimen of 100mg  testosterone  injections. -Order blood work for testosterone  levels, complete blood count, liver and kidney function, cholesterol, and PSA.  Orders: -     Testosterone ,Free and Total; Future  Benign prostatic hyperplasia without lower urinary tract symptoms Assessment & Plan: Labs drawn Will continue to monitor Will refer to urology if levels begin to increase  Orders: -     PSA; Future   General  Health Maintenance / Followup Plans -Schedule physical exam for July 2025. -Schedule nurse visit for blood work on 07/15/2023. -Follow-up in 6 months unless lab results indicate otherwise.   Meds ordered this encounter  Medications   losartan  (COZAAR ) 50 MG tablet    Sig: Take 1 tablet (50 mg total) by mouth daily.     Dispense:  90 tablet    Refill:  0    Orders Placed This Encounter  Procedures   CBC with Differential/Platelet   Comprehensive metabolic panel   Lipid panel   Testosterone ,Free and Total   PSA          Follow-up: Return in about 6 months (around 01/05/2024) for Chronic, Nola.   I,Marla I Leal-Borjas,acting as a scribe for [employer], PA.,have documented all relevant documentation on the behalf of Nola Angles, PA,as directed by  Nola Angles, PA while in the presence of Nola Angles, [place].   An After Visit Summary was printed and given to the patient.  Nola Angles, [place] [facility] [phone]

## 2023-07-08 ENCOUNTER — Encounter: Payer: Self-pay | Admitting: Physician Assistant

## 2023-07-08 ENCOUNTER — Ambulatory Visit: Payer: Commercial Managed Care - PPO | Admitting: Physician Assistant

## 2023-07-08 VITALS — BP 130/70 | HR 87 | Temp 97.9°F | Resp 16 | Ht 69.0 in | Wt 195.8 lb

## 2023-07-08 DIAGNOSIS — I10 Essential (primary) hypertension: Secondary | ICD-10-CM | POA: Diagnosis not present

## 2023-07-08 DIAGNOSIS — N400 Enlarged prostate without lower urinary tract symptoms: Secondary | ICD-10-CM

## 2023-07-08 DIAGNOSIS — E782 Mixed hyperlipidemia: Secondary | ICD-10-CM | POA: Diagnosis not present

## 2023-07-08 DIAGNOSIS — E291 Testicular hypofunction: Secondary | ICD-10-CM | POA: Diagnosis not present

## 2023-07-08 MED ORDER — LOSARTAN POTASSIUM 50 MG PO TABS: 50.0000 mg | ORAL_TABLET | Freq: Every day | ORAL | 0 refills | Status: DC

## 2023-07-08 NOTE — Assessment & Plan Note (Signed)
 Family history of heart disease. Patient's previous cholesterol panel showed good control with diet management. -Order cholesterol panel with upcoming blood work. -Consider starting medication if LDL cholesterol exceeds 100.

## 2023-07-08 NOTE — Assessment & Plan Note (Signed)
 Labs drawn Will continue to monitor Will refer to urology if levels begin to increase

## 2023-07-08 NOTE — Assessment & Plan Note (Signed)
 Patient self-administering injections with variable dosing. No reported side effects such as aggression, headaches, or dizziness. -Continue current regimen of 100mg  testosterone  injections. -Order blood work for testosterone  levels, complete blood count, liver and kidney function, cholesterol, and PSA.

## 2023-07-08 NOTE — Assessment & Plan Note (Signed)
 Patient currently on Losartan with 15 days left of medication. -Send refill for Losartan to [facility].

## 2023-07-15 ENCOUNTER — Other Ambulatory Visit: Payer: BC Managed Care – PPO

## 2023-07-15 DIAGNOSIS — E782 Mixed hyperlipidemia: Secondary | ICD-10-CM | POA: Diagnosis not present

## 2023-07-15 DIAGNOSIS — E291 Testicular hypofunction: Secondary | ICD-10-CM | POA: Diagnosis not present

## 2023-07-15 DIAGNOSIS — I10 Essential (primary) hypertension: Secondary | ICD-10-CM | POA: Diagnosis not present

## 2023-07-15 DIAGNOSIS — N400 Enlarged prostate without lower urinary tract symptoms: Secondary | ICD-10-CM | POA: Diagnosis not present

## 2023-07-17 LAB — CBC WITH DIFFERENTIAL/PLATELET
Basophils Absolute: 0.1 10*3/uL (ref 0.0–0.2)
Basos: 2 %
EOS (ABSOLUTE): 0.2 10*3/uL (ref 0.0–0.4)
Eos: 4 %
Hematocrit: 46.3 % (ref 37.5–51.0)
Hemoglobin: 15.5 g/dL (ref 13.0–17.7)
Immature Grans (Abs): 0 10*3/uL (ref 0.0–0.1)
Immature Granulocytes: 0 %
Lymphocytes Absolute: 1.4 10*3/uL (ref 0.7–3.1)
Lymphs: 33 %
MCH: 32.5 pg (ref 26.6–33.0)
MCHC: 33.5 g/dL (ref 31.5–35.7)
MCV: 97 fL (ref 79–97)
Monocytes Absolute: 0.6 10*3/uL (ref 0.1–0.9)
Monocytes: 15 %
Neutrophils Absolute: 1.9 10*3/uL (ref 1.4–7.0)
Neutrophils: 46 %
Platelets: 250 10*3/uL (ref 150–450)
RBC: 4.77 x10E6/uL (ref 4.14–5.80)
RDW: 11.5 % — ABNORMAL LOW (ref 11.6–15.4)
WBC: 4.1 10*3/uL (ref 3.4–10.8)

## 2023-07-17 LAB — COMPREHENSIVE METABOLIC PANEL
ALT: 29 [IU]/L (ref 0–44)
AST: 26 [IU]/L (ref 0–40)
Albumin: 4.6 g/dL (ref 3.8–4.9)
Alkaline Phosphatase: 54 [IU]/L (ref 44–121)
BUN/Creatinine Ratio: 24 — ABNORMAL HIGH (ref 9–20)
BUN: 22 mg/dL (ref 6–24)
Bilirubin Total: 1.7 mg/dL — ABNORMAL HIGH (ref 0.0–1.2)
CO2: 22 mmol/L (ref 20–29)
Calcium: 9.9 mg/dL (ref 8.7–10.2)
Chloride: 97 mmol/L (ref 96–106)
Creatinine, Ser: 0.91 mg/dL (ref 0.76–1.27)
Globulin, Total: 3.5 g/dL (ref 1.5–4.5)
Glucose: 97 mg/dL (ref 70–99)
Potassium: 5.1 mmol/L (ref 3.5–5.2)
Sodium: 137 mmol/L (ref 134–144)
Total Protein: 8.1 g/dL (ref 6.0–8.5)
eGFR: 98 mL/min/{1.73_m2} (ref >59)

## 2023-07-17 LAB — LIPID PANEL
Chol/HDL Ratio: 2 {ratio} (ref 0.0–5.0)
Cholesterol, Total: 187 mg/dL (ref 100–199)
HDL: 93 mg/dL (ref >39)
LDL Chol Calc (NIH): 83 mg/dL (ref 0–99)
Triglycerides: 56 mg/dL (ref 0–149)
VLDL Cholesterol Cal: 11 mg/dL (ref 5–40)

## 2023-07-17 LAB — PSA: Prostate Specific Ag, Serum: 1.4 ng/mL (ref 0.0–4.0)

## 2023-07-17 LAB — TESTOSTERONE,FREE AND TOTAL
Testosterone, Free: 3.1 pg/mL — ABNORMAL LOW (ref 7.2–24.0)
Testosterone: 196 ng/dL — ABNORMAL LOW (ref 264–916)

## 2023-07-19 ENCOUNTER — Encounter: Payer: Self-pay | Admitting: Physician Assistant

## 2023-08-24 ENCOUNTER — Other Ambulatory Visit: Payer: Self-pay | Admitting: Physician Assistant

## 2023-08-24 DIAGNOSIS — E291 Testicular hypofunction: Secondary | ICD-10-CM

## 2023-08-24 NOTE — Telephone Encounter (Signed)
 Last Fill: 03/24/23  Last OV: 07/08/23 Next OV: 01/12/24  Routing to provider for review/authorization.

## 2023-08-24 NOTE — Telephone Encounter (Signed)
 Copied from CRM [id number]. Topic: Clinical - Medication Refill >> Aug 24, 2023 11:50 AM Antwanette L wrote: Most Recent Primary Care Visit:  Provider: [facility]  Department: [facility]  Visit Type: LAB  Date: 07/15/2023  Medication: testosterone cypionate (DEPOTESTOSTERONE CYPIONATE) 200 MG/ML injection   Has the patient contacted their pharmacy? Yes (Agent: If no, request that the patient contact the pharmacy for the refill. If patient does not wish to contact the pharmacy document the reason why and proceed with request.) (Agent: If yes, when and what did the pharmacy advise?)  Is this the correct pharmacy for this prescription? Yes [facility] [phone] - [place], [place] - [phone] AT [facility] [address] [facility] [address] [place] [address] Phone: [phone] Fax: [phone]   Has the prescription been filled recently? No  Is the patient out of the medication? Yes. Patient will need syringes and needles.  Has the patient been seen for an appointment in the last year OR does the patient have an upcoming appointment? Yes  Can we respond through MyChart? No  Agent: Please be advised that Rx refills may take up to 3 business days. We ask that you follow-up with your pharmacy.

## 2023-08-25 ENCOUNTER — Other Ambulatory Visit (HOSPITAL_COMMUNITY): Payer: Self-pay

## 2023-08-25 MED ORDER — TESTOSTERONE CYPIONATE 200 MG/ML IM SOLN
225.0000 mg | INTRAMUSCULAR | 0 refills | Status: DC
  Filled 2023-08-25: qty 3, 28d supply, fill #0
  Filled 2023-08-25: qty 10, 70d supply, fill #0

## 2023-08-25 NOTE — Telephone Encounter (Unsigned)
 Copied from CRM [id number]. Topic: Clinical - Prescription Issue >> Aug 25, 2023  4:03 PM Gery Pray wrote: Reason for CRM: Patient calling to get a PA due to insurance not covering the medication that was sent in to the pharmacy. Please call patient back at [phone] in regards to getting a prior authorization.

## 2023-08-26 ENCOUNTER — Telehealth: Payer: Self-pay

## 2023-08-26 NOTE — Telephone Encounter (Signed)
 PA has been submitted waiting for approval or denial.   Copied from CRM [id number]. Topic: Clinical - Prescription Issue >> Aug 25, 2023  4:03 PM Gery Pray wrote: Reason for CRM: Patient calling to get a PA due to insurance not covering the medication that was sent in to the pharmacy. Please call patient back at [phone] in regards to getting a prior authorization.

## 2023-08-27 ENCOUNTER — Other Ambulatory Visit (HOSPITAL_COMMUNITY): Payer: Self-pay

## 2023-08-27 ENCOUNTER — Other Ambulatory Visit: Payer: Self-pay | Admitting: Physician Assistant

## 2023-08-27 DIAGNOSIS — E291 Testicular hypofunction: Secondary | ICD-10-CM

## 2023-08-27 MED ORDER — TESTOSTERONE CYPIONATE 200 MG/ML IM SOLN
200.0000 mg | INTRAMUSCULAR | 0 refills | Status: DC
  Filled 2023-08-27: qty 2, 28d supply, fill #0

## 2023-09-01 ENCOUNTER — Telehealth: Payer: Self-pay

## 2023-09-01 ENCOUNTER — Other Ambulatory Visit: Payer: Self-pay

## 2023-09-01 DIAGNOSIS — E291 Testicular hypofunction: Secondary | ICD-10-CM

## 2023-09-01 NOTE — Telephone Encounter (Signed)
 Done  Copied from CRM [id number]. Topic: Clinical - Prescription Issue >> Sep 01, 2023 12:51 PM Antony Haste wrote: Reason for CRM: The patient is requesting for testosterone cypionate (DEPOTESTOSTERONE CYPIONATE) 200 MG/ML injection to be sent to a different pharmacy: [facility] [phone] [place], [place] - [address] AT [facility] OF [address] [facility] [address] & [address] [place] [address] Phone: [phone]  Fax: [phone]

## 2023-09-02 ENCOUNTER — Other Ambulatory Visit: Payer: Self-pay

## 2023-09-03 ENCOUNTER — Telehealth: Payer: Self-pay | Admitting: Physician Assistant

## 2023-09-03 ENCOUNTER — Other Ambulatory Visit: Payer: Self-pay

## 2023-09-03 DIAGNOSIS — E291 Testicular hypofunction: Secondary | ICD-10-CM

## 2023-09-03 NOTE — Telephone Encounter (Signed)
 Copied from CRM [id number]. Topic: Clinical - Prescription Issue >> Sep 03, 2023 12:49 PM Marland Kitchen D wrote: Patient is calling because he hasn't been able to get his prescription. He said the pharmacy doesn't have it and he has called the office multiple times.  testosterone cypionate (DEPOTESTOSTERONE CYPIONATE) 200 MG/ML injection >> Sep 03, 2023 12:51 PM VOZDGUY D wrote: Patient states he is out of the prescription and it is long overdue.

## 2023-09-10 ENCOUNTER — Other Ambulatory Visit: Payer: Self-pay | Admitting: Physician Assistant

## 2023-09-10 DIAGNOSIS — E291 Testicular hypofunction: Secondary | ICD-10-CM

## 2023-09-10 NOTE — Telephone Encounter (Signed)
 Copied from CRM [id number]. Topic: Clinical - Medication Refill >> Sep 10, 2023 12:07 PM Eunice Blase wrote: Most Recent Primary Care Visit:  [facility]  Department: [facility]  Visit Type: LAB  Date: 07/15/2023  Medication: testosterone cypionate (DEPOTESTOSTERONE CYPIONATE) 200 MG/ML injection  Has the patient contacted their pharmacy? Yes (Agent: If no, request that the patient contact the pharmacy for the refill. If patient does not wish to contact the pharmacy document the reason why and proceed with request.) (Agent: If yes, when and what did the pharmacy advise?)Pharmacy needs script, it has prior authorization approval  Is this the correct pharmacy for this prescription? Yes If no, delete pharmacy and type the correct one.  This is the patient's preferred pharmacy:  [facility] [phone] - [place], [place] - [phone] AT [facility] OF [address] [facility] [address] & [address] [place] [address] Phone: [phone] Fax: [phone]   Has the prescription been filled recently? Yes  Is the patient out of the medication? Yes  Has the patient been seen for an appointment in the last year OR does the patient have an upcoming appointment? Yes  Can we respond through MyChart? Yes  Agent: Please be advised that Rx refills may take up to 3 business days. We ask that you follow-up with your pharmacy.

## 2023-09-11 MED ORDER — TESTOSTERONE CYPIONATE 200 MG/ML IM SOLN: 200.0000 mg | INTRAMUSCULAR | 0 refills | Status: DC

## 2023-10-01 ENCOUNTER — Other Ambulatory Visit: Payer: Self-pay | Admitting: Physician Assistant

## 2023-10-01 DIAGNOSIS — E291 Testicular hypofunction: Secondary | ICD-10-CM

## 2023-10-01 NOTE — Telephone Encounter (Signed)
 Copied from CRM [id number]. Topic: Clinical - Medication Refill >> Oct 01, 2023  1:13 PM Ja-Kwan M wrote: Patient stated he does not want to contact the pharmacy every 30 days to refill his medication. Patient stated that he previously received a Rx for like 90 days so he would like to request more than a 30 day supply.   Most Recent Primary Care Visit:  [facility]  [facility]  Visit Type: LAB  Date: 07/15/2023  Medication: testosterone cypionate (DEPOTESTOSTERONE CYPIONATE) 200 MG/ML injection  Has the patient contacted their pharmacy? Yes  Is this the correct pharmacy for this prescription? Yes If no, delete pharmacy and type the correct one.  This is the patient's preferred pharmacy:  [facility] [phone] - [place], [place] - [phone] AT [facility] OF [address] [facility] [address] & [address] [place] [address] Phone: [phone] Fax: [phone]   Has the prescription been filled recently? Yes  Is the patient out of the medication? Yes  Has the patient been seen for an appointment in the last year OR does the patient have an upcoming appointment? Yes  Can we respond through MyChart? Yes  Agent: Please be advised that Rx refills may take up to 3 business days. We ask that you follow-up with your pharmacy.

## 2023-11-01 ENCOUNTER — Other Ambulatory Visit: Payer: Self-pay | Admitting: Physician Assistant

## 2023-11-01 DIAGNOSIS — I10 Essential (primary) hypertension: Secondary | ICD-10-CM

## 2023-11-04 ENCOUNTER — Other Ambulatory Visit: Payer: Self-pay | Admitting: Physician Assistant

## 2023-11-04 DIAGNOSIS — I10 Essential (primary) hypertension: Secondary | ICD-10-CM

## 2023-11-16 ENCOUNTER — Other Ambulatory Visit: Payer: Self-pay | Admitting: Physician Assistant

## 2023-11-16 DIAGNOSIS — E291 Testicular hypofunction: Secondary | ICD-10-CM

## 2023-11-20 DIAGNOSIS — J0100 Acute maxillary sinusitis, unspecified: Secondary | ICD-10-CM | POA: Diagnosis not present

## 2023-12-15 ENCOUNTER — Other Ambulatory Visit: Payer: Self-pay

## 2023-12-15 ENCOUNTER — Other Ambulatory Visit: Payer: Self-pay | Admitting: Physician Assistant

## 2023-12-15 DIAGNOSIS — E291 Testicular hypofunction: Secondary | ICD-10-CM

## 2023-12-15 MED ORDER — TESTOSTERONE CYPIONATE 200 MG/ML IM SOLN: 200.0000 mg | INTRAMUSCULAR | 0 refills | Status: DC

## 2023-12-15 MED ORDER — TESTOSTERONE CYPIONATE 200 MG/ML IM SOLN
200.0000 mg | INTRAMUSCULAR | 0 refills | Status: DC
Start: 2023-12-15 — End: 2023-12-15

## 2024-01-11 NOTE — Progress Notes (Signed)
 Subjective:  Patient ID: Nathan Hughes, male    DOB: 05-Apr-1966  Age: 58 y.o. MRN: [id number]  Chief Complaint  Patient presents with   Medical Management of Chronic Issues   Annual Exam    Hypertension   HPI:  Discussed the use of AI scribe software for clinical note transcription with the patient, who gave verbal consent to proceed.  History of Present Illness   The patient presents with concerns about seborrheic keratosis and family history of melanoma.  He has a spot on his head diagnosed as seborrheic keratosis about a year ago. Additionally, he has a similar spot on his back, which his wife noted has turned gray. He is concerned due to his family history of melanoma.  His mother passed away from metastatic melanoma in March after a rapid decline. She had a lesion removed from her arm two years prior but did not seek further attention until it was too late. His sister also had kidney disease and passed away in July 10, 2024. His father's history includes heart issues, such as a quadruple bypass at 48 and a fatal stroke at 56. Given his family history, he is proactive about his health and interested in more detailed cholesterol testing.  He is currently on testosterone  therapy, which he has been able to obtain without issues.  No pain, abnormal bowel movements with blood or mucus, or difficulty urinating.       Well Adult Physical: Patient here for a comprehensive physical exam.The patient reports problems - spot on his head and one on his back. Do you take any herbs or supplements that were not prescribed by a doctor? no Are you taking calcium supplements? no Are you taking aspirin  daily? yes  Encounter for general adult medical examination without abnormal findings  Physical (At Risk items are starred): Patient's last physical exam was 1 year ago .  Patient wears a seat belt, has smoke detectors, has carbon monoxide detectors, practices appropriate gun safety, and wears  sunscreen with extended sun exposure. Dental Care: biannual cleanings, brushes and flosses daily. Ophthalmology/Optometry: Annual visit.  Hearing loss: NONE Vision impairments: NONE Last PSA:1.4     01/12/2024    7:57 AM 07/08/2023    7:40 AM 01/06/2023    7:23 AM 05/14/2022    7:50 AM 05/07/2021    8:15 AM  Depression screen PHQ 2/9  Decreased Interest 0 0 0 0 0  Down, Depressed, Hopeless 0 0 0 0 0  PHQ - 2 Score 0 0 0 0 0  Altered sleeping 0 0     Tired, decreased energy 0 0     Change in appetite 0 0     Feeling bad or failure about yourself  0 0     Trouble concentrating 0 0     Moving slowly or fidgety/restless 0 0     Suicidal thoughts 0 0     PHQ-9 Score 0 0     Difficult doing work/chores Not difficult at all Not difficult at all            10/08/2021    8:50 PM 10/09/2021   10:39 AM 01/06/2023    7:23 AM 07/08/2023    7:40 AM 01/12/2024    7:57 AM  Fall Risk  Falls in the past year?   0 0 0  Was there an injury with Fall?   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   Past Surgical History:  Procedure Laterality Date   INGUINAL HERNIA REPAIR Right 1997   JOINT REPLACEMENT Right 10/08/2021   TOTAL HIP ARTHROPLASTY Right 10/08/2021   Procedure: Right TOTAL HIP ARTHROPLASTY ANTERIOR APPROACH;  Surgeon: Vernetta Lonni GRADE, MD;  Location: [facility] OR;  Service: Orthopedics;  Laterality: Right;   VASECTOMY      Family History  Problem Relation Age of  Onset   Stroke Father    Heart disease Father    Hypokalemia Sister    Kidney disease Sister    Social History   Socioeconomic History   Marital status: Married    Spouse name: Not on file   Number of children: 2   Years of education: Not on file   Highest education level: Bachelor's degree (e.g., BA, AB, BS)  Occupational History   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[employer]   Occupation: Emergency planning/management officer  Tobacco Use   Smoking status: Never   Smokeless tobacco: Never  Vaping Use   Vaping status: Never Used  Substance and Sexual Activity   Alcohol use: Yes    Alcohol/week: 4.0 standard drinks of alcohol    Types: 4 Cans of beer per week    Comment: ocassionally   Drug use: Never   Sexual activity: Yes    Partners: Female  Other Topics Concern   Not on file  Social History Narrative   Not on file   Social Drivers of Health   Financial Resource Strain: Low Risk  (01/03/2023)   Overall Financial Resource Strain (CARDIA)    Difficulty of Paying Living Expenses: Not hard at all  Food Insecurity: No Food Insecurity (01/03/2023)   Hunger Vital Sign    Worried About Running Out of Food in the Last Year: Never true    Ran Out of Food in the Last Year: Never true  Transportation Needs: No Transportation Needs (01/03/2023)   PRAPARE - Administrator, Civil Service (Medical): No    Lack of Transportation (Non-Medical): No  Physical Activity: Sufficiently Active (01/03/2023)   Exercise Vital Sign    Days of Exercise per Week: 7 days    Minutes of Exercise per Session: 30 min  Stress: No Stress Concern Present (01/03/2023)   [employer] of Occupational Health - Occupational Stress Questionnaire    Feeling of Stress : Not at all  Social Connections: Moderately Integrated (01/03/2023)   Social Connection and Isolation Panel    Frequency of Communication with Friends and Family: More than three times a week    Frequency of Social Gatherings with Friends and  Family: Twice a week    Attends Religious Services: More than 4 times per year    Active Member of [employer] or Organizations: No    Attends Engineer, structural: Not on file    Marital Status: Married   Review of Systems  Constitutional:  Negative for appetite change, fatigue and fever.  HENT:  Negative for congestion, ear pain, sinus pressure and sore throat.   Eyes: Negative.   Respiratory:  Negative for cough, chest tightness, shortness of breath and wheezing.   Cardiovascular:  Negative for chest pain and palpitations.  Gastrointestinal:  Negative for abdominal pain, constipation, diarrhea, nausea and vomiting.  Endocrine: Negative.   Genitourinary:  Negative for dysuria and hematuria.  Musculoskeletal:  Negative for arthralgias, back pain, joint swelling and myalgias.  Skin:  Negative for rash.  Allergic/Immunologic: Negative.   Neurological:  Negative for dizziness, weakness and headaches.  Hematological: Negative.   Psychiatric/Behavioral:  Negative for dysphoric mood. The patient is not nervous/anxious.      Objective:  BP 134/72   Pulse 75   Temp 97.8 F (36.6 C) (Temporal)   Resp 16   Ht 5' 9 (1.753 m)   Wt 189 lb 6.4 oz (85.9 kg)   SpO2 99%   BMI 27.97 kg/m      01/12/2024    7:53 AM 07/08/2023    7:41 AM 01/06/2023    7:20 AM  BP/Weight  Systolic BP 134 130 130  Diastolic BP 72 70 84  Wt. (Lbs) 189.4 195.8 191.4  BMI 27.97 kg/m2 28.91 kg/m2 28.26 kg/m2    Physical Exam Vitals reviewed.  Constitutional:      Appearance: Normal appearance.  Cardiovascular:     Rate and Rhythm: Normal rate and regular rhythm.     Heart sounds: Normal heart sounds.  Pulmonary:     Effort: Pulmonary effort is normal.     Breath sounds: Normal breath sounds.  Abdominal:     General: Bowel sounds are normal.     Palpations: Abdomen is soft.     Tenderness: There is no abdominal tenderness.  Skin:        Comments: Encapsulated cyst on upper back Seborrheic  keratosis on side and top of head.   Neurological:     Mental Status: He is alert and oriented to person, place, and time.  Psychiatric:        Mood and Affect: Mood normal.        Behavior: Behavior normal.     Media Information  Document Information  Photos    01/12/2024 08:33  Attached To:  Office Visit on 01/12/24 with Milon Cleaves, PA  Source Information  Milon Cleaves, [place]  [facility]    Media Information  Document Information  Photos    01/12/2024 08:33  Attached To:  Office Visit on 01/12/24 with Milon Cleaves, PA  Source Information  Milon Cleaves, [place]  [facility]    Media Information  Document Information  Photos    01/12/2024 08:33  Attached To:  Office Visit on 01/12/24 with Milon Cleaves, PA  Source Information  Milon Cleaves, [place]  [facility]    Lab Results  Component Value Date   WBC 4.1 07/15/2023   HGB 15.5 07/15/2023   HCT 46.3 07/15/2023   PLT 250 07/15/2023   GLUCOSE 97 07/15/2023   CHOL 187 07/15/2023   TRIG 56 07/15/2023   HDL 93 07/15/2023   LDLCALC 83 07/15/2023   ALT 29 07/15/2023   AST 26 07/15/2023   NA 137 07/15/2023   K 5.1 07/15/2023   CL 97 07/15/2023   CREATININE 0.91 07/15/2023   BUN 22 07/15/2023   CO2 22 07/15/2023   INR 1.0 07/26/2021   HGBA1C 5.0 07/26/2021  Assessment & Plan:  Encounter for general adult medical examination without abnormal findings  Benign essential hypertension  Mixed hyperlipidemia  Benign prostatic hyperplasia without lower urinary tract symptoms  Testicular hypofunction  General Health Maintenance Proactive in health maintenance with interest in cardiovascular risk assessment due to family history. - Schedule follow-up in six months or sooner if questions or concerns arise after blood test results.       Body mass index is 27.97 kg/m.   These are the goals we discussed:  Goals      Activity and Exercise Increased     Evidence-based  guidance:  Review current exercise levels.  Assess patient perspective on exercise or activity level, barriers to increasing activity, motivation and readiness for change.  Recommend or set healthy exercise goal based on individual tolerance.  Encourage small steps toward making change in amount of exercise or activity.  Urge reduction of sedentary activities or screen time.  Promote group activities within the community or with family or support person.  Consider referral to rehabiliation therapist for assessment and exercise/activity plan.   Notes:          This is a list of the screening recommended for you and due dates:  Health Maintenance  Topic Date Due   Hepatitis B Vaccine (1 of 3 - 19+ 3-dose series) Never done   Flu Shot  01/29/2024   Colon Cancer Screening  10/31/2028   DTaP/Tdap/Td vaccine (2 - Td or Tdap) 01/29/2032   Hepatitis C Screening  Completed   HIV Screening  Completed   Zoster (Shingles) Vaccine  Completed   HPV Vaccine  Aged Out   Meningitis B Vaccine  Aged Out   COVID-19 Vaccine  Discontinued     No orders of the defined types were placed in this encounter.    Follow-up: No follow-ups on file.  An After Visit Summary was printed and given to the patient.    I,Lauren M Auman,acting as a Neurosurgeon for [employer], PA.,have documented all relevant documentation on the behalf of Nola Angles, PA,as directed by  Nola Angles, PA while in the presence of Nola Angles, [place].    Nola Angles, [place] [facility] [phone]

## 2024-01-12 ENCOUNTER — Encounter: Payer: Self-pay | Admitting: Physician Assistant

## 2024-01-12 ENCOUNTER — Ambulatory Visit (INDEPENDENT_AMBULATORY_CARE_PROVIDER_SITE_OTHER): Payer: BC Managed Care – PPO | Admitting: Physician Assistant

## 2024-01-12 VITALS — BP 134/72 | HR 75 | Temp 97.8°F | Resp 16 | Ht 69.0 in | Wt 189.4 lb

## 2024-01-12 DIAGNOSIS — E782 Mixed hyperlipidemia: Secondary | ICD-10-CM

## 2024-01-12 DIAGNOSIS — E291 Testicular hypofunction: Secondary | ICD-10-CM | POA: Diagnosis not present

## 2024-01-12 DIAGNOSIS — I10 Essential (primary) hypertension: Secondary | ICD-10-CM | POA: Diagnosis not present

## 2024-01-12 DIAGNOSIS — Z0000 Encounter for general adult medical examination without abnormal findings: Secondary | ICD-10-CM | POA: Diagnosis not present

## 2024-01-12 DIAGNOSIS — Z8249 Family history of ischemic heart disease and other diseases of the circulatory system: Secondary | ICD-10-CM

## 2024-01-12 DIAGNOSIS — Z823 Family history of stroke: Secondary | ICD-10-CM | POA: Diagnosis not present

## 2024-01-12 DIAGNOSIS — L570 Actinic keratosis: Secondary | ICD-10-CM

## 2024-01-12 DIAGNOSIS — N400 Enlarged prostate without lower urinary tract symptoms: Secondary | ICD-10-CM | POA: Diagnosis not present

## 2024-01-12 DIAGNOSIS — L821 Other seborrheic keratosis: Secondary | ICD-10-CM

## 2024-01-12 NOTE — Patient Instructions (Signed)
 VISIT SUMMARY:  During your visit, we discussed your concerns about seborrheic keratosis and your family history of melanoma. We also reviewed your cardiovascular risk given your family history of heart disease. You are proactive about your health, and we have planned further evaluations to address your concerns.  YOUR PLAN:  -SEBORRHEIC KERATOSIS: Seborrheic keratosis is a common, non-cancerous skin growth. Given your family history of melanoma, we will refer you to dermatology for a thorough evaluation and possible treatment, such as cryotherapy or excision. Please take photographs of the lesions to share with the dermatologist.  -CARDIOVASCULAR RISK ASSESSMENT: Given your family history of heart disease, we will conduct a detailed cardiovascular risk assessment. Your cholesterol levels are normal, but we will order an advanced lipid panel to get a comprehensive understanding of your heart health. Depending on the results, we may also consider a calcium scan of your heart. We discussed the costs and insurance coverage for these tests.  -GENERAL HEALTH MAINTENANCE: You are proactive about your health, which is excellent. We will schedule a follow-up appointment in six months or sooner if you have any questions or concerns after receiving your blood test results.  INSTRUCTIONS:  Please schedule an appointment with dermatology for the evaluation of your seborrheic keratosis. Take photographs of the lesions on your scalp and back to share with the dermatologist. Additionally, we will conduct an advanced lipid panel to assess your cardiovascular risk. Depending on the results, we may consider a calcium scan of your heart. Follow up in six months or sooner if you have any questions or concerns after receiving your blood test results.

## 2024-01-12 NOTE — Assessment & Plan Note (Signed)
 Controlled on Losartan  50mg  Continue to work on diet and exercise Denies any side effects Will adjust treatment based on readings BP Readings from Last 3 Encounters:  01/12/24 134/72  07/08/23 130/70  01/06/23 130/84

## 2024-01-12 NOTE — Assessment & Plan Note (Signed)
 Lesions on scalp and back with potential malignancy concern due to family history of melanoma. Dermatology evaluation needed. - Refer to dermatology for evaluation and possible cryotherapy or excision. - Take photographs of the lesions for dermatology consultation.

## 2024-01-13 LAB — COMPREHENSIVE METABOLIC PANEL WITH GFR
ALT: 31 IU/L (ref 0–44)
AST: 31 IU/L (ref 0–40)
Albumin: 4.5 g/dL (ref 3.8–4.9)
Alkaline Phosphatase: 55 IU/L (ref 44–121)
BUN/Creatinine Ratio: 22 — ABNORMAL HIGH (ref 9–20)
BUN: 20 mg/dL (ref 6–24)
Bilirubin Total: 1.9 mg/dL — ABNORMAL HIGH (ref 0.0–1.2)
CO2: 24 mmol/L (ref 20–29)
Calcium: 9.8 mg/dL (ref 8.7–10.2)
Chloride: 97 mmol/L (ref 96–106)
Creatinine, Ser: 0.91 mg/dL (ref 0.76–1.27)
Globulin, Total: 3.4 g/dL (ref 1.5–4.5)
Glucose: 101 mg/dL — ABNORMAL HIGH (ref 70–99)
Potassium: 4.8 mmol/L (ref 3.5–5.2)
Sodium: 133 mmol/L — ABNORMAL LOW (ref 134–144)
Total Protein: 7.9 g/dL (ref 6.0–8.5)
eGFR: 98 mL/min/1.73 (ref >59)

## 2024-01-13 LAB — NMR LIPOPROF WSUBCLS+GRAPH
Cholesterol, Total: 179 mg/dL (ref 100–199)
HDL Particle Number: 38.7 umol/L (ref >=30.5)
HDL Size: 10.6 nm (ref >=9.2)
HDL-C: 101 mg/dL (ref >39)
LDL Particle Number: 483 nmol/L (ref <1000)
LDL Size: 21.3 nm (ref >20.5)
LDL Size: 21.3 nm (ref >=20.8)
LDL-C (NIH Calc): 69 mg/dL (ref 0–99)
LP-IR Score: <25 (ref <=45)
Large HDL-P: 14.3 umol/L (ref >=4.8)
Large VLDL-P: 1.3 nmol/L (ref <=2.7)
Small LDL Particle Number: <90 nmol/L (ref <=527)
Small LDL-P: <90 nmol/L (ref <=527)
Triglycerides: 44 mg/dL (ref 0–149)
VLDL Size: 51.3 nm — ABNORMAL HIGH (ref <=46.6)

## 2024-01-13 LAB — CBC WITH DIFFERENTIAL/PLATELET
Basophils Absolute: 0.1 x10E3/uL (ref 0.0–0.2)
Basos: 2 %
EOS (ABSOLUTE): 0.1 x10E3/uL (ref 0.0–0.4)
Eos: 3 %
Hematocrit: 43.5 % (ref 37.5–51.0)
Hemoglobin: 14.2 g/dL (ref 13.0–17.7)
Immature Grans (Abs): 0 x10E3/uL (ref 0.0–0.1)
Immature Granulocytes: 0 %
Lymphocytes Absolute: 1 x10E3/uL (ref 0.7–3.1)
Lymphs: 27 %
MCH: 32.3 pg (ref 26.6–33.0)
MCHC: 32.6 g/dL (ref 31.5–35.7)
MCV: 99 fL — ABNORMAL HIGH (ref 79–97)
Monocytes Absolute: 0.7 x10E3/uL (ref 0.1–0.9)
Monocytes: 17 %
Neutrophils Absolute: 2 x10E3/uL (ref 1.4–7.0)
Neutrophils: 51 %
Platelets: 267 x10E3/uL (ref 150–450)
RBC: 4.4 x10E6/uL (ref 4.14–5.80)
RDW: 11.6 % (ref 11.6–15.4)
WBC: 3.9 x10E3/uL (ref 3.4–10.8)

## 2024-01-13 LAB — TESTOSTERONE,FREE AND TOTAL
Testosterone, Free: 2.3 pg/mL — ABNORMAL LOW (ref 7.2–24.0)
Testosterone: 218 ng/dL — ABNORMAL LOW (ref 264–916)

## 2024-01-14 ENCOUNTER — Ambulatory Visit: Payer: Self-pay | Admitting: Physician Assistant

## 2024-01-14 ENCOUNTER — Other Ambulatory Visit: Payer: Self-pay | Admitting: Physician Assistant

## 2024-01-14 DIAGNOSIS — E291 Testicular hypofunction: Secondary | ICD-10-CM

## 2024-01-18 NOTE — Assessment & Plan Note (Signed)
 Controlled Labs drawn today Continue to monitor symptoms Will adjust treatment based on results

## 2024-01-18 NOTE — Assessment & Plan Note (Signed)
 Labs drawn Will continue to monitor Will refer to urology if levels begin to increase

## 2024-01-18 NOTE — Assessment & Plan Note (Signed)
 Family history of heart disease. Patient's previous cholesterol panel showed good control with diet management. -Order cholesterol panel with upcoming blood work. -Consider starting medication if LDL cholesterol exceeds 100. Lab Results  Component Value Date   LDLCALC 83 07/15/2023

## 2024-01-25 LAB — SPECIMEN STATUS REPORT

## 2024-01-25 LAB — PSA: Prostate Specific Ag, Serum: 3 ng/mL (ref 0.0–4.0)

## 2024-02-21 ENCOUNTER — Other Ambulatory Visit: Payer: Self-pay | Admitting: Physician Assistant

## 2024-02-21 DIAGNOSIS — E291 Testicular hypofunction: Secondary | ICD-10-CM

## 2024-02-26 DIAGNOSIS — L814 Other melanin hyperpigmentation: Secondary | ICD-10-CM | POA: Diagnosis not present

## 2024-02-26 DIAGNOSIS — D485 Neoplasm of uncertain behavior of skin: Secondary | ICD-10-CM | POA: Diagnosis not present

## 2024-02-26 DIAGNOSIS — L820 Inflamed seborrheic keratosis: Secondary | ICD-10-CM | POA: Diagnosis not present

## 2024-02-26 DIAGNOSIS — L578 Other skin changes due to chronic exposure to nonionizing radiation: Secondary | ICD-10-CM | POA: Diagnosis not present

## 2024-02-26 DIAGNOSIS — D225 Melanocytic nevi of trunk: Secondary | ICD-10-CM | POA: Diagnosis not present

## 2024-03-05 DIAGNOSIS — M545 Low back pain, unspecified: Secondary | ICD-10-CM | POA: Diagnosis not present

## 2024-03-23 ENCOUNTER — Other Ambulatory Visit: Payer: Self-pay | Admitting: Physician Assistant

## 2024-03-23 DIAGNOSIS — E291 Testicular hypofunction: Secondary | ICD-10-CM

## 2024-04-25 ENCOUNTER — Other Ambulatory Visit: Payer: Self-pay | Admitting: Physician Assistant

## 2024-04-25 DIAGNOSIS — E291 Testicular hypofunction: Secondary | ICD-10-CM

## 2024-05-05 ENCOUNTER — Other Ambulatory Visit: Payer: Self-pay | Admitting: Physician Assistant

## 2024-05-05 DIAGNOSIS — I10 Essential (primary) hypertension: Secondary | ICD-10-CM

## 2024-05-23 ENCOUNTER — Other Ambulatory Visit: Payer: Self-pay | Admitting: Physician Assistant

## 2024-05-23 DIAGNOSIS — E291 Testicular hypofunction: Secondary | ICD-10-CM

## 2024-06-21 ENCOUNTER — Other Ambulatory Visit: Payer: Self-pay | Admitting: Physician Assistant

## 2024-06-21 DIAGNOSIS — E291 Testicular hypofunction: Secondary | ICD-10-CM

## 2024-07-11 ENCOUNTER — Other Ambulatory Visit: Payer: Self-pay

## 2024-07-11 DIAGNOSIS — E291 Testicular hypofunction: Secondary | ICD-10-CM

## 2024-07-11 DIAGNOSIS — E782 Mixed hyperlipidemia: Secondary | ICD-10-CM

## 2024-07-11 DIAGNOSIS — I10 Essential (primary) hypertension: Secondary | ICD-10-CM

## 2024-07-12 ENCOUNTER — Other Ambulatory Visit

## 2024-07-12 DIAGNOSIS — E782 Mixed hyperlipidemia: Secondary | ICD-10-CM

## 2024-07-12 DIAGNOSIS — I10 Essential (primary) hypertension: Secondary | ICD-10-CM

## 2024-07-12 DIAGNOSIS — E291 Testicular hypofunction: Secondary | ICD-10-CM

## 2024-07-14 ENCOUNTER — Ambulatory Visit: Admitting: Physician Assistant

## 2024-07-14 ENCOUNTER — Encounter: Payer: Self-pay | Admitting: Physician Assistant

## 2024-07-14 VITALS — BP 136/88 | HR 78 | Temp 97.8°F | Ht 69.0 in | Wt 196.0 lb

## 2024-07-14 DIAGNOSIS — E782 Mixed hyperlipidemia: Secondary | ICD-10-CM

## 2024-07-14 DIAGNOSIS — I10 Essential (primary) hypertension: Secondary | ICD-10-CM | POA: Diagnosis not present

## 2024-07-14 DIAGNOSIS — M1711 Unilateral primary osteoarthritis, right knee: Secondary | ICD-10-CM | POA: Insufficient documentation

## 2024-07-14 DIAGNOSIS — E291 Testicular hypofunction: Secondary | ICD-10-CM

## 2024-07-14 DIAGNOSIS — M1612 Unilateral primary osteoarthritis, left hip: Secondary | ICD-10-CM

## 2024-07-14 LAB — CBC WITH DIFFERENTIAL/PLATELET
Basophils Absolute: 0.1 x10E3/uL (ref 0.0–0.2)
Basos: 1 %
EOS (ABSOLUTE): 0.2 x10E3/uL (ref 0.0–0.4)
Eos: 3 %
Hematocrit: 46.6 % (ref 37.5–51.0)
Hemoglobin: 15.2 g/dL (ref 13.0–17.7)
Immature Grans (Abs): 0 x10E3/uL (ref 0.0–0.1)
Immature Granulocytes: 0 %
Lymphocytes Absolute: 1.2 x10E3/uL (ref 0.7–3.1)
Lymphs: 21 %
MCH: 32.7 pg (ref 26.6–33.0)
MCHC: 32.6 g/dL (ref 31.5–35.7)
MCV: 100 fL — ABNORMAL HIGH (ref 79–97)
Monocytes Absolute: 1 x10E3/uL — ABNORMAL HIGH (ref 0.1–0.9)
Monocytes: 16 %
Neutrophils Absolute: 3.4 x10E3/uL (ref 1.4–7.0)
Neutrophils: 59 %
Platelets: 298 x10E3/uL (ref 150–450)
RBC: 4.65 x10E6/uL (ref 4.14–5.80)
RDW: 11.9 % (ref 11.6–15.4)
WBC: 5.9 x10E3/uL (ref 3.4–10.8)

## 2024-07-14 LAB — COMPREHENSIVE METABOLIC PANEL WITH GFR
ALT: 23 IU/L (ref 0–44)
AST: 20 IU/L (ref 0–40)
Albumin: 4.6 g/dL (ref 3.8–4.9)
Alkaline Phosphatase: 59 IU/L (ref 47–123)
BUN/Creatinine Ratio: 17 (ref 9–20)
BUN: 16 mg/dL (ref 6–24)
Bilirubin Total: 1.6 mg/dL — ABNORMAL HIGH (ref 0.0–1.2)
CO2: 21 mmol/L (ref 20–29)
Calcium: 9.7 mg/dL (ref 8.7–10.2)
Chloride: 97 mmol/L (ref 96–106)
Creatinine, Ser: 0.95 mg/dL (ref 0.76–1.27)
Globulin, Total: 3.4 g/dL (ref 1.5–4.5)
Glucose: 106 mg/dL — ABNORMAL HIGH (ref 70–99)
Potassium: 4.6 mmol/L (ref 3.5–5.2)
Sodium: 135 mmol/L (ref 134–144)
Total Protein: 8 g/dL (ref 6.0–8.5)
eGFR: 93 mL/min/1.73

## 2024-07-14 LAB — LIPID PANEL
Chol/HDL Ratio: 1.9 ratio (ref 0.0–5.0)
Cholesterol, Total: 157 mg/dL (ref 100–199)
HDL: 83 mg/dL
LDL Chol Calc (NIH): 64 mg/dL (ref 0–99)
Triglycerides: 47 mg/dL (ref 0–149)
VLDL Cholesterol Cal: 10 mg/dL (ref 5–40)

## 2024-07-14 LAB — TESTOSTERONE,FREE AND TOTAL
Testosterone, Free: 9.5 pg/mL (ref 7.2–24.0)
Testosterone: 532 ng/dL (ref 264–916)

## 2024-07-14 LAB — TSH: TSH: 2.77 u[IU]/mL (ref 0.450–4.500)

## 2024-07-14 MED ORDER — TESTOSTERONE CYPIONATE 200 MG/ML IM SOLN: 200.0000 mg | INTRAMUSCULAR | 0 refills | Status: AC

## 2024-07-14 MED ORDER — DICLOFENAC SODIUM 75 MG PO TBEC: 75.0000 mg | DELAYED_RELEASE_TABLET | Freq: Two times a day (BID) | ORAL | 0 refills | Status: AC

## 2024-07-14 MED ORDER — PREDNISONE 20 MG PO TABS: ORAL_TABLET | ORAL | 0 refills | Status: AC

## 2024-07-14 NOTE — Assessment & Plan Note (Addendum)
 Controlled on Losartan  50mg  Continue to work on diet and exercise Denies any side effects Will adjust treatment based on readings BP Readings from Last 3 Encounters:  07/14/24 136/88  01/12/24 134/72  07/08/23 130/70

## 2024-07-14 NOTE — Assessment & Plan Note (Signed)
 Primary osteoarthritis and meniscal tear, right knee Chronic right knee pain with limited range of motion and catching sensation, suggestive of meniscal tear and osteoarthritis. Discussed surgical and injection options. - Referred to Dr. Dempsey Sensor for evaluation and potential surgical intervention or injection therapy. - Discussed potential for Synvisc or steroid injections as temporary relief options.

## 2024-07-14 NOTE — Assessment & Plan Note (Addendum)
 Testicular hypofunction Testosterone  replacement therapy maintained normal total testosterone  levels. Awaiting free testosterone  results. - Continue current testosterone  replacement therapy regimen. - Ordered larger vial of testosterone  to avoid partial vial use. - Will monitor free testosterone  levels once available. Orders:   testosterone  cypionate (DEPOTESTOSTERONE CYPIONATE) 200 MG/ML injection; Inject 1 mL (200 mg total) into the muscle every 14 (fourteen) days.

## 2024-07-14 NOTE — Progress Notes (Signed)
 "  Subjective:  Patient ID: Nathan Hughes, male    DOB: February 23, 1966  Age: 59 y.o. MRN: [id number]  Chief Complaint  Patient presents with   Medical Management of Chronic Issues    HPI: Discussed the use of AI scribe software for clinical note transcription with the patient, who gave verbal consent to proceed.  History of Present Illness Nathan Hughes is a 59 year old male with right hip osteoarthritis who presents with worsening hip and knee pain.  He has worsening pain in his right hip, previously diagnosed with osteoarthritis. The pain is dull, constant, and primarily located in the groin area. He reports that laying down does not relieve the pain. He experiences difficulty bending down and picking up objects, with pain exacerbated by weight-bearing activities. Taking shorter strides helps alleviate some discomfort. He manages the pain with ibuprofen and Tylenol , but relief is minimal.  He also experiences issues with his right knee, which have worsened over time. The knee feels as though something is catching inside, and he experiences limited range of motion. No recent falls or injuries to the knee. He has not pursued further treatment due to starting a new job.  His past medical history includes chronic low back pain, complicating his ability to discern if numbness or tingling in the leg is related to his hip or back issues. He had a right hip replacement previously, which was successful. He is currently taking ibuprofen and Tylenol  for pain management. He has a known allergy to lisinopril, which causes a cough.           01/12/2024    7:57 AM 07/08/2023    7:40 AM 01/06/2023    7:23 AM 05/14/2022    7:50 AM 05/07/2021    8:15 AM  Depression screen PHQ 2/9  Decreased Interest 0 0 0 0 0  Down, Depressed, Hopeless 0 0 0 0 0  PHQ - 2 Score 0 0 0 0 0  Altered sleeping 0 0     Tired, decreased energy 0 0     Change in appetite 0 0     Feeling bad or failure about yourself   0 0     Trouble concentrating 0 0     Moving slowly or fidgety/restless 0 0     Suicidal thoughts 0 0     PHQ-9 Score 0  0      Difficult doing work/chores Not difficult at all Not difficult at all        Data saved with a previous flowsheet row definition        01/12/2024    7:57 AM  Fall Risk   Falls in the past year? 0  Number falls in past yr: 0  Injury with Fall? 0   Risk for fall due to : No Fall Risks  Follow up Falls evaluation completed     Data saved with a previous flowsheet row definition    Patient Care Team: Milon Cleaves, [place] as PCP - General (Physician Assistant)   Review of Systems  Constitutional:  Negative for appetite change, fatigue and fever.  HENT:  Negative for congestion, ear pain, sinus pressure and sore throat.   Respiratory:  Negative for cough, chest tightness, shortness of breath and wheezing.   Cardiovascular:  Negative for chest pain and palpitations.  Gastrointestinal:  Negative for abdominal pain, constipation, diarrhea, nausea and vomiting.  Genitourinary:  Negative for dysuria and hematuria.  Musculoskeletal:  Negative for arthralgias, back pain,  joint swelling and myalgias.  Skin:  Negative for rash.  Neurological:  Negative for dizziness, weakness and headaches.  Psychiatric/Behavioral:  Negative for dysphoric mood. The patient is not nervous/anxious.     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   Past Surgical History:  Procedure Laterality Date   INGUINAL HERNIA REPAIR Right 1997   JOINT REPLACEMENT Right 10/08/2021   TOTAL HIP ARTHROPLASTY Right 10/08/2021   Procedure: Right TOTAL HIP ARTHROPLASTY ANTERIOR APPROACH;  Surgeon: Vernetta Lonni GRADE, MD;  Location: [facility] OR;  Service: Orthopedics;  Laterality: Right;   VASECTOMY      Family  History  Problem Relation Age of Onset   Stroke Father    Heart disease Father    Hypokalemia Sister    Kidney disease Sister    Social History   Socioeconomic History   Marital status: Married    Spouse name: Not on file   Number of children: 2   Years of education: Not on file   Highest education level: Bachelor's degree (e.g., BA, AB, BS)  Occupational History   Occupation: Event Organiser: [employer]   Occupation: Emergency planning/management officer  Tobacco Use   Smoking status: Never   Smokeless tobacco: Never  Vaping Use   Vaping status: Never Used  Substance and Sexual Activity   Alcohol use: Yes    Alcohol/week: 4.0 standard drinks of alcohol    Types: 4 Cans of beer per week    Comment: ocassionally   Drug use: Never   Sexual activity: Yes    Partners: Female  Other Topics Concern   Not on file  Social History Narrative   Not on file   Social Drivers of Health   Tobacco Use: Low Risk (07/14/2024)   Patient History    Smoking Tobacco Use: Never    Smokeless Tobacco Use: Never    Passive Exposure: Not on file  Financial Resource Strain: Low Risk (07/13/2024)   Overall Financial Resource Strain (CARDIA)    Difficulty of Paying Living Expenses: Not hard at all  Food Insecurity: No Food Insecurity (07/13/2024)   Epic    Worried About Programme Researcher, Broadcasting/film/video in the Last Year: Never true    Ran Out of Food in the Last Year: Never true  Transportation Needs: No Transportation Needs (07/13/2024)   Epic    Lack of Transportation (Medical): No    Lack of Transportation (Non-Medical): No  Physical Activity: Sufficiently Active (07/13/2024)   Exercise Vital Sign    Days of Exercise per Week: 7 days    Minutes of Exercise per Session: 30 min  Stress: No Stress Concern Present (07/13/2024)   [employer] of Occupational Health - Occupational Stress Questionnaire    Feeling of Stress: Not at all  Social Connections: Moderately Integrated (07/13/2024)   Social  Connection and Isolation Panel    Frequency of Communication with Friends and Family: More than three times a week    Frequency of Social Gatherings with Friends and Family: More than three times a week    Attends Religious Services: More than 4 times per year    Active Member of Clubs or Organizations: No    Attends Banker Meetings: Not on file    Marital Status: Married  Depression (PHQ2-9): Low Risk (01/12/2024)  Depression (PHQ2-9)    PHQ-2 Score: 0  Alcohol Screen: Low Risk (07/13/2024)   Alcohol Screen    Last Alcohol Screening Score (AUDIT): 3  Housing: Low Risk (07/13/2024)   Epic    Unable to Pay for Housing in the Last Year: No    Number of Times Moved in the Last Year: 0    Homeless in the Last Year: No  Utilities: Not At Risk (01/06/2023)   [facility] Utilities    Threatened with loss of utilities: No  Health Literacy: Not on file    Objective:  BP 136/88 (BP Location: Left Arm, Patient Position: Sitting)   Pulse 78   Temp 97.8 F (36.6 C) (Temporal)   Ht 5' 9 (1.753 m)   Wt 196 lb (88.9 kg)   SpO2 97%   BMI 28.94 kg/m      07/14/2024    8:38 AM 01/12/2024    7:53 AM 07/08/2023    7:41 AM  BP/Weight  Systolic BP 136 134 130  Diastolic BP 88 72 70  Wt. (Lbs) 196 189.4 195.8  BMI 28.94 kg/m2 27.97 kg/m2 28.91 kg/m2    Physical Exam Vitals reviewed.  Constitutional:      Appearance: Normal appearance.  Neck:     Vascular: No carotid bruit.  Cardiovascular:     Rate and Rhythm: Normal rate and regular rhythm.     Heart sounds: Normal heart sounds.  Pulmonary:     Effort: Pulmonary effort is normal.     Breath sounds: Normal breath sounds.  Abdominal:     General: Bowel sounds are normal.     Palpations: Abdomen is soft.     Tenderness: There is no abdominal tenderness.  Musculoskeletal:        General: Tenderness present. No swelling.     Left hip: Tenderness present. Decreased range of motion.     Right knee: Decreased range of motion.  Tenderness present. No LCL laxity or MCL laxity.     Instability Tests: Anterior drawer test negative. Posterior drawer test negative.  Neurological:     Mental Status: He is alert and oriented to person, place, and time.  Psychiatric:        Mood and Affect: Mood normal.        Behavior: Behavior normal.       Lab Results  Component Value Date   WBC 3.9 01/12/2024   HGB 14.2 01/12/2024   HCT 43.5 01/12/2024   PLT 267 01/12/2024   GLUCOSE 101 (H) 01/12/2024   CHOL 187 07/15/2023   TRIG 56 07/15/2023   HDL 93 07/15/2023   LDLCALC 83 07/15/2023   ALT 31 01/12/2024   AST 31 01/12/2024   NA 133 (L) 01/12/2024   K 4.8 01/12/2024   CL 97 01/12/2024   CREATININE 0.91 01/12/2024   BUN 20 01/12/2024   CO2 24 01/12/2024   INR 1.0 07/26/2021   HGBA1C 5.0 07/26/2021    Results for orders placed or performed in visit on 01/12/24  CBC with Differential/Platelet   Collection Time: 01/12/24  8:41 AM  Result Value Ref Range   WBC 3.9 3.4 - 10.8 x10E3/uL   RBC 4.40 4.14 - 5.80 x10E6/uL   Hemoglobin 14.2 13.0 - 17.7 g/dL   Hematocrit 56.4 62.4 - 51.0 %   MCV 99 (H) 79 - 97 fL   MCH 32.3 26.6 - 33.0 pg   MCHC 32.6 31.5 - 35.7 g/dL   RDW 88.3 88.3 - 84.5 %  Platelets 267 150 - 450 x10E3/uL   Neutrophils 51 Not Estab. %   Lymphs 27 Not Estab. %   Monocytes 17 Not Estab. %   Eos 3 Not Estab. %   Basos 2 Not Estab. %   Neutrophils Absolute 2.0 1.4 - 7.0 x10E3/uL   Lymphocytes Absolute 1.0 0.7 - 3.1 x10E3/uL   Monocytes Absolute 0.7 0.1 - 0.9 x10E3/uL   EOS (ABSOLUTE) 0.1 0.0 - 0.4 x10E3/uL   Basophils Absolute 0.1 0.0 - 0.2 x10E3/uL   Immature Granulocytes 0 Not Estab. %   Immature Grans (Abs) 0.0 0.0 - 0.1 x10E3/uL  Comprehensive metabolic panel with GFR   Collection Time: 01/12/24  8:41 AM  Result Value Ref Range   Glucose 101 (H) 70 - 99 mg/dL   BUN 20 6 - 24 mg/dL   Creatinine, Ser 9.08 0.76 - 1.27 mg/dL   eGFR 98 >40 fO/fpw/8.26   BUN/Creatinine Ratio 22 (H) 9 - 20    Sodium 133 (L) 134 - 144 mmol/L   Potassium 4.8 3.5 - 5.2 mmol/L   Chloride 97 96 - 106 mmol/L   CO2 24 20 - 29 mmol/L   Calcium 9.8 8.7 - 10.2 mg/dL   Total Protein 7.9 6.0 - 8.5 g/dL   Albumin 4.5 3.8 - 4.9 g/dL   Globulin, Total 3.4 1.5 - 4.5 g/dL   Bilirubin Total 1.9 (H) 0.0 - 1.2 mg/dL   Alkaline Phosphatase 55 44 - 121 IU/L   AST 31 0 - 40 IU/L   ALT 31 0 - 44 IU/L  Testosterone ,Free and Total   Collection Time: 01/12/24  8:41 AM  Result Value Ref Range   Testosterone  218 (L) 264 - 916 ng/dL   Testosterone , Free 2.3 (L) 7.2 - 24.0 pg/mL  NMR Lipoprof wSubCls+Graph   Collection Time: 01/12/24  8:41 AM  Result Value Ref Range   LDL Particle Number 483 <1,000 nmol/L   LDL-C (NIH Calc) 69 0 - 99 mg/dL   HDL-C 898 >60 mg/dL   Triglycerides 44 0 - 149 mg/dL   Cholesterol, Total 820 100 - 199 mg/dL   HDL Particle Number 61.2 >=30.5 umol/L   Small LDL Particle Number <90 <=527 nmol/L   LDL Size 21.3 >20.5 nm   Large VLDL-P 1.3 <=2.7 nmol/L   Small LDL-P <90 <=527 nmol/L   Large HDL-P 14.3 >=4.8 umol/L   VLDL Size 51.3 (H) <=46.6 nm   LDL Size 21.3 >=20.8 nm   HDL Size 10.6 >=9.2 nm   LP-IR Score <25 <=45  PSA   Collection Time: 01/12/24  8:41 AM  Result Value Ref Range   Prostate Specific Ag, Serum 3.0 0.0 - 4.0 ng/mL  Specimen status report   Collection Time: 01/12/24  8:41 AM  Result Value Ref Range   specimen status report Comment   .  Assessment & Plan:   Assessment & Plan Mixed hyperlipidemia Family history of heart disease. Patient's previous cholesterol panel showed good control with diet management. -Order cholesterol panel with upcoming blood work. -Consider starting medication if LDL cholesterol exceeds 100. - Continue to monitor diet and exercise.  Lab Results  Component Value Date   LDLCALC 83 07/15/2023        Benign essential hypertension Controlled on Losartan  50mg  Continue to work on diet and exercise Denies any side effects Will adjust  treatment based on readings BP Readings from Last 3 Encounters:  07/14/24 136/88  01/12/24 134/72  07/08/23 130/70  Testicular hypofunction Testicular hypofunction Testosterone  replacement therapy maintained normal total testosterone  levels. Awaiting free testosterone  results. - Continue current testosterone  replacement therapy regimen. - Ordered larger vial of testosterone  to avoid partial vial use. - Will monitor free testosterone  levels once available. Orders:   testosterone  cypionate (DEPOTESTOSTERONE CYPIONATE) 200 MG/ML injection; Inject 1 mL (200 mg total) into the muscle every 14 (fourteen) days.  Primary osteoarthritis of left hip Primary osteoarthritis of left hip Chronic left hip pain with bone-on-bone contact, no acute injury. Inflammation indicated by pain relief from joint manipulation. - Referred to Dr. Dempsey Sensor for evaluation and potential hip replacement surgery. - Prescribed a steroid pack to reduce inflammation. - Prescribed diclofenac  for joint inflammation. - Advised rotating Tylenol  and ibuprofen for pain management. Orders:   Ambulatory referral to Orthopedic Surgery   predniSONE  (DELTASONE ) 20 MG tablet; Take 3 tablets (60 mg total) by mouth daily with breakfast for 3 days, THEN 2 tablets (40 mg total) daily with breakfast for 3 days, THEN 1 tablet (20 mg total) daily with breakfast for 3 days.   diclofenac  (VOLTAREN ) 75 MG EC tablet; Take 1 tablet (75 mg total) by mouth 2 (two) times daily.  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 Discussed surgical and injection options. - Referred to Dr. Dempsey Sensor for evaluation and potential surgical intervention or injection therapy. - Discussed potential for Synvisc or steroid injections as temporary relief options.      Body mass  index is 28.94 kg/m.    Meds ordered this encounter  Medications   predniSONE  (DELTASONE ) 20 MG tablet    Sig: Take 3 tablets (60 mg total) by mouth daily with breakfast for 3 days, THEN 2 tablets (40 mg total) daily with breakfast for 3 days, THEN 1 tablet (20 mg total) daily with breakfast for 3 days.    Dispense:  18 tablet    Refill:  0   diclofenac  (VOLTAREN ) 75 MG EC tablet    Sig: Take 1 tablet (75 mg total) by mouth 2 (two) times daily.    Dispense:  30 tablet    Refill:  0   testosterone  cypionate (DEPOTESTOSTERONE CYPIONATE) 200 MG/ML injection    Sig: Inject 1 mL (200 mg total) into the muscle every 14 (fourteen) days.    Dispense:  10 mL    Refill:  0    Orders Placed This Encounter  Procedures   Ambulatory referral to Orthopedic Surgery       Follow-up: Return in about 6 months (around 01/11/2025) for Chronic, Nola.  An After Visit Summary was printed and given to the patient.     I,Lauren M Auman,acting as a neurosurgeon for [employer], [employer].,have documented all relevant documentation on the behalf of Nola Angles, PA,as directed by  Nola Angles, PA while in the presence of Nola Angles, [place].    Nola Angles, [place] [facility] [phone]     [1]  Current Outpatient Medications on File Prior to Visit  Medication Sig Dispense Refill   aspirin  81 MG chewable tablet Chew 1 tablet (81 mg total) by mouth 2 (two) times daily. 35 tablet 0   Glucosamine-Chondroit-Vit C-Mn (GLUCOSAMINE-CHONDROITIN) TABS Take 2 tablets by mouth daily.     losartan  (COZAAR ) 50 MG tablet TAKE 1 TABLET(50 MG) BY MOUTH DAILY 90 tablet 0   Multiple Vitamins-Minerals (MENS MULTIVITAMIN) CHEW Chew  2 tablets by mouth daily.     No current facility-administered medications on file prior to visit.   "

## 2024-07-14 NOTE — Assessment & Plan Note (Addendum)
 Family history of heart disease. Patient's previous cholesterol panel showed good control with diet management. -Order cholesterol panel with upcoming blood work. -Consider starting medication if LDL cholesterol exceeds 100. - Continue to monitor diet and exercise.  Lab Results  Component Value Date   LDLCALC 83 07/15/2023

## 2024-07-18 ENCOUNTER — Ambulatory Visit: Payer: Self-pay | Admitting: Physician Assistant

## 2024-08-05 ENCOUNTER — Other Ambulatory Visit: Payer: Self-pay | Admitting: Physician Assistant

## 2024-08-05 DIAGNOSIS — I10 Essential (primary) hypertension: Secondary | ICD-10-CM

## 2025-01-12 ENCOUNTER — Ambulatory Visit: Admitting: Physician Assistant
